# Patient Record
Sex: Female | Born: 1940 | Race: White | Hispanic: No | Smoking: Former smoker
Health system: Southern US, Community
[De-identification: ages and names within clinical notes are randomized; demographics above are authoritative.]

## PROBLEM LIST (undated history)

## (undated) DIAGNOSIS — M1990 Unspecified osteoarthritis, unspecified site: Secondary | ICD-10-CM

## (undated) DIAGNOSIS — F32A Depression, unspecified: Secondary | ICD-10-CM

## (undated) DIAGNOSIS — K5903 Drug induced constipation: Secondary | ICD-10-CM

## (undated) DIAGNOSIS — M545 Low back pain, unspecified: Secondary | ICD-10-CM

## (undated) DIAGNOSIS — K219 Gastro-esophageal reflux disease without esophagitis: Secondary | ICD-10-CM

## (undated) DIAGNOSIS — G8929 Other chronic pain: Secondary | ICD-10-CM

## (undated) DIAGNOSIS — K5190 Ulcerative colitis, unspecified, without complications: Secondary | ICD-10-CM

## (undated) DIAGNOSIS — E119 Type 2 diabetes mellitus without complications: Secondary | ICD-10-CM

## (undated) DIAGNOSIS — R251 Tremor, unspecified: Secondary | ICD-10-CM

## (undated) DIAGNOSIS — F329 Major depressive disorder, single episode, unspecified: Secondary | ICD-10-CM

## (undated) DIAGNOSIS — E1142 Type 2 diabetes mellitus with diabetic polyneuropathy: Secondary | ICD-10-CM

## (undated) HISTORY — PX: JOINT REPLACEMENT: SHX530

## (undated) HISTORY — PX: BACK SURGERY: SHX140

## (undated) HISTORY — PX: CATARACT EXTRACTION W/ INTRAOCULAR LENS  IMPLANT, BILATERAL: SHX1307

## (undated) HISTORY — PX: COLONOSCOPY: SHX174

## (undated) HISTORY — PX: TOTAL HIP ARTHROPLASTY: SHX124

---

## 2011-07-10 HISTORY — PX: TOTAL SHOULDER ARTHROPLASTY: SHX126

## 2015-07-22 ENCOUNTER — Other Ambulatory Visit: Payer: Self-pay | Admitting: Neurosurgery

## 2015-08-07 ENCOUNTER — Encounter (HOSPITAL_COMMUNITY)
Admission: RE | Admit: 2015-08-07 | Discharge: 2015-08-07 | Disposition: A | Discharge status: Home / Self Care | Payer: Medicare Other | Source: Ambulatory Visit | Attending: Neurosurgery | Admitting: Neurosurgery

## 2015-08-07 ENCOUNTER — Encounter (HOSPITAL_COMMUNITY): Payer: Self-pay

## 2015-08-07 DIAGNOSIS — Z0183 Encounter for blood typing: Secondary | ICD-10-CM | POA: Insufficient documentation

## 2015-08-07 DIAGNOSIS — M4316 Spondylolisthesis, lumbar region: Secondary | ICD-10-CM | POA: Diagnosis not present

## 2015-08-07 DIAGNOSIS — E119 Type 2 diabetes mellitus without complications: Secondary | ICD-10-CM | POA: Diagnosis not present

## 2015-08-07 DIAGNOSIS — Z794 Long term (current) use of insulin: Secondary | ICD-10-CM | POA: Insufficient documentation

## 2015-08-07 DIAGNOSIS — Z01812 Encounter for preprocedural laboratory examination: Secondary | ICD-10-CM | POA: Diagnosis not present

## 2015-08-07 DIAGNOSIS — Z79899 Other long term (current) drug therapy: Secondary | ICD-10-CM | POA: Insufficient documentation

## 2015-08-07 HISTORY — DX: Gastro-esophageal reflux disease without esophagitis: K21.9

## 2015-08-07 HISTORY — DX: Depression, unspecified: F32.A

## 2015-08-07 HISTORY — DX: Drug induced constipation: K59.03

## 2015-08-07 HISTORY — DX: Major depressive disorder, single episode, unspecified: F32.9

## 2015-08-07 HISTORY — DX: Tremor, unspecified: R25.1

## 2015-08-07 HISTORY — DX: Ulcerative colitis, unspecified, without complications: K51.90

## 2015-08-07 HISTORY — DX: Unspecified osteoarthritis, unspecified site: M19.90

## 2015-08-07 LAB — CBC
HCT: 42.5 % (ref 36.0–46.0)
Hemoglobin: 14.5 g/dL (ref 12.0–15.0)
MCH: 31.9 pg (ref 26.0–34.0)
MCHC: 34.1 g/dL (ref 30.0–36.0)
MCV: 93.4 fL (ref 78.0–100.0)
PLATELETS: 237 10*3/uL (ref 150–400)
RBC: 4.55 MIL/uL (ref 3.87–5.11)
RDW: 12.6 % (ref 11.5–15.5)
WBC: 9.4 10*3/uL (ref 4.0–10.5)

## 2015-08-07 LAB — BASIC METABOLIC PANEL
Anion gap: 11 (ref 5–15)
BUN: 19 mg/dL (ref 6–20)
CHLORIDE: 104 mmol/L (ref 101–111)
CO2: 22 mmol/L (ref 22–32)
CREATININE: 0.98 mg/dL (ref 0.44–1.00)
Calcium: 9.9 mg/dL (ref 8.9–10.3)
GFR calc Af Amer: >60 mL/min (ref >60)
GFR, EST NON AFRICAN AMERICAN: 54 mL/min — AB (ref >60)
GLUCOSE: 121 mg/dL — AB (ref 65–99)
Potassium: 4.2 mmol/L (ref 3.5–5.1)
SODIUM: 137 mmol/L (ref 135–145)

## 2015-08-07 LAB — ABO/RH: ABO/RH(D): A POS

## 2015-08-07 LAB — TYPE AND SCREEN
ABO/RH(D): A POS
Antibody Screen: NEGATIVE

## 2015-08-07 LAB — SURGICAL PCR SCREEN
MRSA, PCR: NEGATIVE
Staphylococcus aureus: NEGATIVE

## 2015-08-07 LAB — GLUCOSE, CAPILLARY: GLUCOSE-CAPILLARY: 95 mg/dL (ref 65–99)

## 2015-08-07 NOTE — Pre-Procedure Instructions (Signed)
Sheri Reid  08/07/2015    Your procedure is scheduled on Thursday, March 2  Report to [facility] Admitting at 5:30 A.M.                     Your surgery or procedure is scheduled for 7:30 AM   Call this number if you have problems the morning of surgery:[phone]                For any other questions, please call [phone], Monday - Friday 8 AM - 4 PM.   Remember:  Do not eat food or drink liquids after midnight Wednesday, March 1  Take these medicines the morning of surgery with A SIP OF WATER :APRISO.                Take if needed: Tramadol                  Stop taking Aspirin, Coumadin, Plavix, Effient and Herbal medications- Vitamins.  Do not take any NSAIDs ie: Ibuprofen,  Advil,Naproxen or any medication containing Aspirin.  How to Manage Your Diabetes Before Surgery Why is it important to control my blood sugar before and after surgery?   Improving blood sugar levels before and after surgery helps healing and can limit problems.  A way of improving blood sugar control is eating a healthy diet by:  - Eating less sugar and carbohydrates  - Increasing activity/exercise  - Talk with your doctor about reaching your blood sugar goals  High blood sugars (greater than 180 mg/dL) can raise your risk of infections and slow down your recovery so you will need to focus on controlling your diabetes during the weeks before surgery.  Make sure that the doctor who takes care of your diabetes knows about your planned surgery including the date and location.  How do I manage my blood sugars before surgery?   Check your blood sugar at least 4 times a day, 2 days before surgery to make sure that they are not too high or low.   Check your blood sugar the morning of your surgery when you wake up and every 2 hours until you get to the Short-Stay unit.  If your blood sugar is less than 70 mg/dL, you will need to treat for low blood sugar by:  Treat a low blood sugar  (less than 70 mg/dL) with 1/2 cup of clear juice (cranberry or apple), 4 glucose tablets, OR glucose gel.  Recheck blood sugar in 15 minutes after treatment (to make sure it is greater than 70 mg/dL).  If blood sugar is not greater than 70 mg/dL on re-check, call [phone] for further instructions.   Report your blood sugar to the Short-Stay nurse when you get to Short-Stay.  References:  [facility], 2007 "How to Manage your Diabetes Before and After Surgery".  What do I do about my diabetes medications?   Do not take oral diabetes medicines (pills) the morning of surgery.    THE NIGHT BEFORE SURGERY, take 35  units of Lantus Insulin.   Do not take other diabetes injectables the day of surgery including Byetta, Victoza, Bydureon, and Trulicity.    If your CBG is greater than 220 mg/dL, you may take 1/2 of your sliding scale (correction) dose of insul    Do not wear jewelry, make-up or nail polish.  Do not wear lotions, powders, or perfumes  Do not shave 48  hours prior to surgery.    Do not bring valuables to the hospital.  [facility] is not responsible for any belongings or valuables.  Contacts, dentures or bridgework may not be worn into surgery.  Leave your suitcase in the car.  After surgery it may be brought to your room.  For patients admitted to the hospital, discharge time will be determined by your treatment team.  Patients discharged the day of surgery will not be allowed to drive home.   Name and phone number of your driver:   -  Special instructions:  Review  [facility] - Preparing For Surgery.  Please read over the following fact sheets that you were given. Pain Booklet, Coughing and Deep Breathing, Blood Transfusion Information, MRSA Information and Surgical Site Infection Prevention

## 2015-08-07 NOTE — Progress Notes (Signed)
Ms Sheri Reid denies chest pain or shortness of breath. Patient reports that she see Dr Cyndie Mull ([place] every year and has an EKG every year.  "I was there in July 2016.  Patient also reports that she had a stress test in the past 4 years.  I sent a fax requesting records.  I also requested records from Dr Kenton Kingfisher, patients PCP. Pats endocrinologist is Dr Bud Face at [facility].

## 2015-08-08 LAB — HEMOGLOBIN A1C
Hgb A1c MFr Bld: 7.7 % — ABNORMAL HIGH (ref 4.8–5.6)
MEAN PLASMA GLUCOSE: 174 mg/dL

## 2015-08-11 NOTE — Progress Notes (Signed)
Spoke with office of Dr Alroy Dust in [place] and re-requested EKG, Stress, ECHO, and OV notes.  Stated they will fax now.  Spoke with Dr Kenton Kingfisher' office and left voice mail for medical records previously requested (Labs, OV note)

## 2015-08-12 MED ORDER — CEFAZOLIN SODIUM-DEXTROSE 2-3 GM-% IV SOLR
2.0000 g | INTRAVENOUS | Status: AC
  Administered 2015-08-13 (×2): 2 g via INTRAVENOUS

## 2015-08-13 ENCOUNTER — Inpatient Hospital Stay (HOSPITAL_COMMUNITY): Payer: Medicare Other

## 2015-08-13 ENCOUNTER — Inpatient Hospital Stay (HOSPITAL_COMMUNITY)
Admission: RE | Admit: 2015-08-13 | Discharge: 2015-08-26 | DRG: 456 | Disposition: A | Discharge status: Home Health | Payer: Medicare Other | Source: Ambulatory Visit | Attending: Family Medicine | Admitting: Family Medicine

## 2015-08-13 ENCOUNTER — Inpatient Hospital Stay (HOSPITAL_COMMUNITY): Payer: Medicare Other | Admitting: Anesthesiology

## 2015-08-13 ENCOUNTER — Encounter (HOSPITAL_COMMUNITY): Payer: Self-pay | Admitting: Surgery

## 2015-08-13 ENCOUNTER — Encounter (HOSPITAL_COMMUNITY)
Admission: RE | Disposition: A | Discharge status: Home Health | Payer: Self-pay | Source: Ambulatory Visit | Attending: Neurosurgery

## 2015-08-13 DIAGNOSIS — K5669 Other intestinal obstruction: Secondary | ICD-10-CM | POA: Diagnosis not present

## 2015-08-13 DIAGNOSIS — M4806 Spinal stenosis, lumbar region: Secondary | ICD-10-CM | POA: Diagnosis present

## 2015-08-13 DIAGNOSIS — M419 Scoliosis, unspecified: Secondary | ICD-10-CM | POA: Diagnosis present

## 2015-08-13 DIAGNOSIS — E11649 Type 2 diabetes mellitus with hypoglycemia without coma: Secondary | ICD-10-CM | POA: Diagnosis not present

## 2015-08-13 DIAGNOSIS — K219 Gastro-esophageal reflux disease without esophagitis: Secondary | ICD-10-CM | POA: Diagnosis present

## 2015-08-13 DIAGNOSIS — Z87891 Personal history of nicotine dependence: Secondary | ICD-10-CM

## 2015-08-13 DIAGNOSIS — R109 Unspecified abdominal pain: Secondary | ICD-10-CM

## 2015-08-13 DIAGNOSIS — K598 Other specified functional intestinal disorders: Secondary | ICD-10-CM | POA: Diagnosis not present

## 2015-08-13 DIAGNOSIS — Z833 Family history of diabetes mellitus: Secondary | ICD-10-CM | POA: Diagnosis not present

## 2015-08-13 DIAGNOSIS — M5116 Intervertebral disc disorders with radiculopathy, lumbar region: Secondary | ICD-10-CM | POA: Diagnosis present

## 2015-08-13 DIAGNOSIS — Z9842 Cataract extraction status, left eye: Secondary | ICD-10-CM | POA: Diagnosis not present

## 2015-08-13 DIAGNOSIS — I10 Essential (primary) hypertension: Secondary | ICD-10-CM | POA: Diagnosis present

## 2015-08-13 DIAGNOSIS — K5190 Ulcerative colitis, unspecified, without complications: Secondary | ICD-10-CM

## 2015-08-13 DIAGNOSIS — R911 Solitary pulmonary nodule: Secondary | ICD-10-CM

## 2015-08-13 DIAGNOSIS — R197 Diarrhea, unspecified: Secondary | ICD-10-CM | POA: Diagnosis not present

## 2015-08-13 DIAGNOSIS — R140 Abdominal distension (gaseous): Secondary | ICD-10-CM | POA: Diagnosis not present

## 2015-08-13 DIAGNOSIS — M4316 Spondylolisthesis, lumbar region: Secondary | ICD-10-CM | POA: Diagnosis present

## 2015-08-13 DIAGNOSIS — R112 Nausea with vomiting, unspecified: Secondary | ICD-10-CM

## 2015-08-13 DIAGNOSIS — Z794 Long term (current) use of insulin: Secondary | ICD-10-CM | POA: Diagnosis not present

## 2015-08-13 DIAGNOSIS — E118 Type 2 diabetes mellitus with unspecified complications: Secondary | ICD-10-CM

## 2015-08-13 DIAGNOSIS — K5100 Ulcerative (chronic) pancolitis without complications: Secondary | ICD-10-CM | POA: Diagnosis not present

## 2015-08-13 DIAGNOSIS — M1990 Unspecified osteoarthritis, unspecified site: Secondary | ICD-10-CM | POA: Diagnosis present

## 2015-08-13 DIAGNOSIS — M549 Dorsalgia, unspecified: Secondary | ICD-10-CM | POA: Diagnosis present

## 2015-08-13 DIAGNOSIS — Z96643 Presence of artificial hip joint, bilateral: Secondary | ICD-10-CM | POA: Diagnosis present

## 2015-08-13 DIAGNOSIS — K56609 Unspecified intestinal obstruction, unspecified as to partial versus complete obstruction: Secondary | ICD-10-CM

## 2015-08-13 DIAGNOSIS — K6812 Psoas muscle abscess: Secondary | ICD-10-CM | POA: Diagnosis not present

## 2015-08-13 DIAGNOSIS — M4120 Other idiopathic scoliosis, site unspecified: Secondary | ICD-10-CM | POA: Diagnosis present

## 2015-08-13 DIAGNOSIS — Z9889 Other specified postprocedural states: Secondary | ICD-10-CM

## 2015-08-13 DIAGNOSIS — K913 Postprocedural intestinal obstruction: Secondary | ICD-10-CM | POA: Diagnosis not present

## 2015-08-13 DIAGNOSIS — K5660 Unspecified intestinal obstruction: Secondary | ICD-10-CM | POA: Diagnosis not present

## 2015-08-13 DIAGNOSIS — Z79899 Other long term (current) drug therapy: Secondary | ICD-10-CM | POA: Diagnosis not present

## 2015-08-13 DIAGNOSIS — E1142 Type 2 diabetes mellitus with diabetic polyneuropathy: Secondary | ICD-10-CM | POA: Diagnosis present

## 2015-08-13 DIAGNOSIS — R1084 Generalized abdominal pain: Secondary | ICD-10-CM | POA: Diagnosis not present

## 2015-08-13 DIAGNOSIS — Z961 Presence of intraocular lens: Secondary | ICD-10-CM | POA: Diagnosis present

## 2015-08-13 DIAGNOSIS — G8918 Other acute postprocedural pain: Secondary | ICD-10-CM

## 2015-08-13 DIAGNOSIS — Z9841 Cataract extraction status, right eye: Secondary | ICD-10-CM | POA: Diagnosis not present

## 2015-08-13 DIAGNOSIS — K5939 Other megacolon: Secondary | ICD-10-CM | POA: Diagnosis not present

## 2015-08-13 DIAGNOSIS — Z96611 Presence of right artificial shoulder joint: Secondary | ICD-10-CM | POA: Diagnosis present

## 2015-08-13 DIAGNOSIS — M5440 Lumbago with sciatica, unspecified side: Secondary | ICD-10-CM | POA: Diagnosis present

## 2015-08-13 DIAGNOSIS — K567 Ileus, unspecified: Secondary | ICD-10-CM | POA: Diagnosis not present

## 2015-08-13 DIAGNOSIS — E162 Hypoglycemia, unspecified: Secondary | ICD-10-CM

## 2015-08-13 HISTORY — DX: Type 2 diabetes mellitus with diabetic polyneuropathy: E11.42

## 2015-08-13 HISTORY — PX: ANTERIOR LATERAL LUMBAR FUSION WITH PERCUTANEOUS SCREW 3 LEVEL: SHX5555

## 2015-08-13 HISTORY — DX: Low back pain: M54.5

## 2015-08-13 HISTORY — PX: LUMBAR PERCUTANEOUS PEDICLE SCREW 3 LEVEL: SHX5562

## 2015-08-13 HISTORY — DX: Other chronic pain: G89.29

## 2015-08-13 HISTORY — DX: Low back pain, unspecified: M54.50

## 2015-08-13 HISTORY — PX: ANTERIOR LAT LUMBAR FUSION: SHX1168

## 2015-08-13 HISTORY — DX: Type 2 diabetes mellitus without complications: E11.9

## 2015-08-13 LAB — GLUCOSE, CAPILLARY
GLUCOSE-CAPILLARY: 165 mg/dL — AB (ref 65–99)
Glucose-Capillary: 120 mg/dL — ABNORMAL HIGH (ref 65–99)
Glucose-Capillary: 106 mg/dL — ABNORMAL HIGH (ref 65–99)

## 2015-08-13 SURGERY — ANTERIOR LATERAL LUMBAR FUSION 3 LEVELS
Anesthesia: General | Site: Flank

## 2015-08-13 MED ORDER — HYDROCODONE-ACETAMINOPHEN 5-325 MG PO TABS: 1.0000 | ORAL_TABLET | Freq: Three times a day (TID) | ORAL | Status: DC | PRN

## 2015-08-13 MED ORDER — SODIUM CHLORIDE 0.9% FLUSH: 3.0000 mL | INTRAVENOUS | Status: DC | PRN

## 2015-08-13 MED ORDER — TIZANIDINE HCL 2 MG PO TABS
1.0000 mg | ORAL_TABLET | Freq: Every day | ORAL | Status: DC
  Administered 2015-08-13 – 2015-08-25 (×12): 1 mg via ORAL
  Filled 2015-08-13 (×13): qty 0.5

## 2015-08-13 MED ORDER — LIRAGLUTIDE 18 MG/3ML ~~LOC~~ SOPN
1.8000 mg | PEN_INJECTOR | SUBCUTANEOUS | Status: DC
  Administered 2015-08-15: 1.8 mg via SUBCUTANEOUS

## 2015-08-13 MED ORDER — FENTANYL CITRATE (PF) 100 MCG/2ML IJ SOLN
INTRAMUSCULAR | Status: DC | PRN
  Administered 2015-08-13: 25 ug via INTRAVENOUS
  Administered 2015-08-13 (×4): 50 ug via INTRAVENOUS
  Administered 2015-08-13: 25 ug via INTRAVENOUS
  Administered 2015-08-13 (×2): 50 ug via INTRAVENOUS

## 2015-08-13 MED ORDER — PROPOFOL 10 MG/ML IV BOLUS
INTRAVENOUS | Status: AC
  Filled 2015-08-13: qty 40

## 2015-08-13 MED ORDER — HYDROMORPHONE HCL 1 MG/ML IJ SOLN
0.2500 mg | INTRAMUSCULAR | Status: DC | PRN
  Administered 2015-08-13 (×4): 0.5 mg via INTRAVENOUS

## 2015-08-13 MED ORDER — INSULIN GLARGINE 100 UNIT/ML SOLOSTAR PEN: 44.0000 [IU] | PEN_INJECTOR | Freq: Every day | SUBCUTANEOUS | Status: DC

## 2015-08-13 MED ORDER — LISINOPRIL 10 MG PO TABS
10.0000 mg | ORAL_TABLET | Freq: Every day | ORAL | Status: DC
  Administered 2015-08-13 – 2015-08-26 (×12): 10 mg via ORAL
  Filled 2015-08-13 (×13): qty 1

## 2015-08-13 MED ORDER — MIDAZOLAM HCL 2 MG/2ML IJ SOLN
INTRAMUSCULAR | Status: AC
  Filled 2015-08-13: qty 2

## 2015-08-13 MED ORDER — VITAMIN B-12 1000 MCG PO TABS
3000.0000 ug | ORAL_TABLET | Freq: Every day | ORAL | Status: DC
  Administered 2015-08-14 – 2015-08-26 (×9): 3000 ug via ORAL
  Filled 2015-08-13 (×13): qty 3

## 2015-08-13 MED ORDER — ACETAMINOPHEN 325 MG PO TABS
650.0000 mg | ORAL_TABLET | ORAL | Status: DC | PRN
  Administered 2015-08-25 – 2015-08-26 (×2): 650 mg via ORAL
  Filled 2015-08-13 (×2): qty 2

## 2015-08-13 MED ORDER — VITAMIN D (ERGOCALCIFEROL) 1.25 MG (50000 UNIT) PO CAPS
50000.0000 [IU] | ORAL_CAPSULE | ORAL | Status: DC
  Administered 2015-08-15: 50000 [IU] via ORAL
  Filled 2015-08-13 (×2): qty 1

## 2015-08-13 MED ORDER — RISAQUAD PO CAPS
ORAL_CAPSULE | Freq: Every day | ORAL | Status: DC
  Administered 2015-08-14 – 2015-08-26 (×9): 1 via ORAL
  Filled 2015-08-13 (×12): qty 1

## 2015-08-13 MED ORDER — METFORMIN HCL 500 MG PO TABS
1000.0000 mg | ORAL_TABLET | Freq: Two times a day (BID) | ORAL | Status: DC
  Administered 2015-08-14 – 2015-08-18 (×9): 1000 mg via ORAL
  Filled 2015-08-13 (×12): qty 2

## 2015-08-13 MED ORDER — LEVOCETIRIZINE DIHYDROCHLORIDE 5 MG PO TABS: 5.0000 mg | ORAL_TABLET | Freq: Every day | ORAL | Status: DC

## 2015-08-13 MED ORDER — INSULIN GLARGINE 100 UNIT/ML ~~LOC~~ SOLN
44.0000 [IU] | Freq: Every day | SUBCUTANEOUS | Status: DC
  Administered 2015-08-15 – 2015-08-18 (×5): 44 [IU] via SUBCUTANEOUS
  Filled 2015-08-13 (×6): qty 0.44

## 2015-08-13 MED ORDER — DOCUSATE SODIUM 100 MG PO CAPS
100.0000 mg | ORAL_CAPSULE | Freq: Two times a day (BID) | ORAL | Status: DC
  Administered 2015-08-13 – 2015-08-26 (×23): 100 mg via ORAL
  Filled 2015-08-13 (×25): qty 1

## 2015-08-13 MED ORDER — INSULIN ASPART 100 UNIT/ML FLEXPEN: 1.0000 [IU] | PEN_INJECTOR | Freq: Every day | SUBCUTANEOUS | Status: DC | PRN

## 2015-08-13 MED ORDER — MIDAZOLAM HCL 5 MG/5ML IJ SOLN
INTRAMUSCULAR | Status: DC | PRN
  Administered 2015-08-13: 1 mg via INTRAVENOUS

## 2015-08-13 MED ORDER — HYDROMORPHONE HCL 1 MG/ML IJ SOLN
INTRAMUSCULAR | Status: AC
  Filled 2015-08-13: qty 1

## 2015-08-13 MED ORDER — PHENOL 1.4 % MT LIQD
1.0000 | OROMUCOSAL | Status: DC | PRN
Start: 2015-08-13 — End: 2015-08-26
  Administered 2015-08-17: 1 via OROMUCOSAL
  Filled 2015-08-13: qty 177

## 2015-08-13 MED ORDER — ACETAMINOPHEN 500 MG PO TABS: 500.0000 mg | ORAL_TABLET | Freq: Two times a day (BID) | ORAL | Status: DC | PRN

## 2015-08-13 MED ORDER — ALUM & MAG HYDROXIDE-SIMETH 200-200-20 MG/5ML PO SUSP
30.0000 mL | Freq: Four times a day (QID) | ORAL | Status: DC | PRN
  Administered 2015-08-16 – 2015-08-19 (×3): 30 mL via ORAL
  Filled 2015-08-13 (×3): qty 30

## 2015-08-13 MED ORDER — PHENYLEPHRINE HCL 10 MG/ML IJ SOLN
10.0000 mg | INTRAMUSCULAR | Status: DC | PRN
  Administered 2015-08-13: 25 ug/min via INTRAVENOUS

## 2015-08-13 MED ORDER — SODIUM CHLORIDE 0.9% FLUSH
3.0000 mL | Freq: Two times a day (BID) | INTRAVENOUS | Status: DC
  Administered 2015-08-13 – 2015-08-23 (×14): 3 mL via INTRAVENOUS
  Administered 2015-08-24: 03:00:00 via INTRAVENOUS
  Administered 2015-08-24 – 2015-08-26 (×5): 3 mL via INTRAVENOUS

## 2015-08-13 MED ORDER — LORATADINE 10 MG PO TABS
10.0000 mg | ORAL_TABLET | Freq: Every day | ORAL | Status: DC
  Administered 2015-08-13 – 2015-08-26 (×11): 10 mg via ORAL
  Filled 2015-08-13 (×13): qty 1

## 2015-08-13 MED ORDER — METHOCARBAMOL 1000 MG/10ML IJ SOLN: 500.0000 mg | Freq: Four times a day (QID) | INTRAVENOUS | Status: DC | PRN

## 2015-08-13 MED ORDER — PROMETHAZINE HCL 25 MG/ML IJ SOLN: 6.2500 mg | INTRAMUSCULAR | Status: DC | PRN

## 2015-08-13 MED ORDER — FENTANYL CITRATE (PF) 250 MCG/5ML IJ SOLN
INTRAMUSCULAR | Status: AC
  Filled 2015-08-13: qty 5

## 2015-08-13 MED ORDER — ADULT MULTIVITAMIN W/MINERALS CH
1.0000 | ORAL_TABLET | Freq: Every day | ORAL | Status: DC
  Administered 2015-08-13 – 2015-08-26 (×10): 1 via ORAL
  Filled 2015-08-13 (×14): qty 1

## 2015-08-13 MED ORDER — CO Q-10 200 MG PO CAPS: 200.0000 mg | ORAL_CAPSULE | Freq: Every day | ORAL | Status: DC

## 2015-08-13 MED ORDER — SODIUM CHLORIDE 0.9 % IV SOLN
250.0000 mL | INTRAVENOUS | Status: DC
  Administered 2015-08-13: 250 mL via INTRAVENOUS

## 2015-08-13 MED ORDER — HYDROCODONE-ACETAMINOPHEN 5-325 MG PO TABS
1.0000 | ORAL_TABLET | ORAL | Status: DC | PRN
  Administered 2015-08-14: 1 via ORAL
  Administered 2015-08-15 – 2015-08-20 (×4): 2 via ORAL
  Filled 2015-08-13 (×4): qty 2
  Filled 2015-08-13: qty 1
  Filled 2015-08-13: qty 2

## 2015-08-13 MED ORDER — 0.9 % SODIUM CHLORIDE (POUR BTL) OPTIME
TOPICAL | Status: DC | PRN
  Administered 2015-08-13: 1000 mL

## 2015-08-13 MED ORDER — POLYETHYLENE GLYCOL 3350 17 G PO PACK
17.0000 g | PACK | Freq: Every day | ORAL | Status: DC | PRN
  Administered 2015-08-15 – 2015-08-18 (×2): 17 g via ORAL
  Filled 2015-08-13 (×4): qty 1

## 2015-08-13 MED ORDER — BUPIVACAINE HCL (PF) 0.5 % IJ SOLN
INTRAMUSCULAR | Status: DC | PRN
  Administered 2015-08-13: 15 mL

## 2015-08-13 MED ORDER — BUPIVACAINE LIPOSOME 1.3 % IJ SUSP
20.0000 mL | Freq: Once | INTRAMUSCULAR | Status: DC
  Filled 2015-08-13: qty 20

## 2015-08-13 MED ORDER — HYDROMORPHONE HCL 1 MG/ML IJ SOLN
0.5000 mg | INTRAMUSCULAR | Status: DC | PRN
  Administered 2015-08-13 – 2015-08-14 (×4): 1 mg via INTRAVENOUS
  Administered 2015-08-15: 0.5 mg via INTRAVENOUS
  Administered 2015-08-15 – 2015-08-16 (×5): 1 mg via INTRAVENOUS
  Administered 2015-08-16: 0.5 mg via INTRAVENOUS
  Administered 2015-08-16 – 2015-08-25 (×19): 1 mg via INTRAVENOUS
  Filled 2015-08-13 (×31): qty 1

## 2015-08-13 MED ORDER — LIDOCAINE-EPINEPHRINE 1 %-1:100000 IJ SOLN
INTRAMUSCULAR | Status: DC | PRN
  Administered 2015-08-13: 15 mL

## 2015-08-13 MED ORDER — ONDANSETRON HCL 4 MG/2ML IJ SOLN
INTRAMUSCULAR | Status: DC | PRN
  Administered 2015-08-13: 4 mg via INTRAVENOUS

## 2015-08-13 MED ORDER — BIOTIN 5 MG PO CAPS: 5000.0000 mg | ORAL_CAPSULE | Freq: Every day | ORAL | Status: DC

## 2015-08-13 MED ORDER — ACETAMINOPHEN 10 MG/ML IV SOLN
INTRAVENOUS | Status: AC
  Administered 2015-08-13: 1000 mg via INTRAVENOUS
  Filled 2015-08-13: qty 100

## 2015-08-13 MED ORDER — PROPOFOL 10 MG/ML IV BOLUS
INTRAVENOUS | Status: DC | PRN
  Administered 2015-08-13: 150 mg via INTRAVENOUS

## 2015-08-13 MED ORDER — PROPOFOL 1000 MG/100ML IV EMUL
INTRAVENOUS | Status: AC
Start: 2015-08-13 — End: 2015-08-13
  Administered 2015-08-13: 50 ug/kg/min via INTRAVENOUS
  Filled 2015-08-13: qty 100

## 2015-08-13 MED ORDER — CEFAZOLIN SODIUM-DEXTROSE 2-3 GM-% IV SOLR
2.0000 g | Freq: Three times a day (TID) | INTRAVENOUS | Status: AC
  Administered 2015-08-13 – 2015-08-14 (×2): 2 g via INTRAVENOUS
  Filled 2015-08-13 (×2): qty 50

## 2015-08-13 MED ORDER — BISACODYL 10 MG RE SUPP
10.0000 mg | Freq: Every day | RECTAL | Status: DC | PRN
  Administered 2015-08-15 – 2015-08-16 (×2): 10 mg via RECTAL
  Filled 2015-08-13 (×3): qty 1

## 2015-08-13 MED ORDER — LIDOCAINE HCL (CARDIAC) 20 MG/ML IV SOLN
INTRAVENOUS | Status: DC | PRN
  Administered 2015-08-13: 60 mg via INTRAVENOUS

## 2015-08-13 MED ORDER — SODIUM CHLORIDE 0.9 % IV SOLN
INTRAVENOUS | Status: DC | PRN
  Administered 2015-08-13 (×2): via INTRAVENOUS

## 2015-08-13 MED ORDER — VITAMIN C 500 MG PO TABS
1000.0000 mg | ORAL_TABLET | Freq: Every day | ORAL | Status: DC
  Administered 2015-08-13 – 2015-08-26 (×10): 1000 mg via ORAL
  Filled 2015-08-13 (×13): qty 2

## 2015-08-13 MED ORDER — INSULIN ASPART 100 UNIT/ML ~~LOC~~ SOLN
1.0000 [IU] | Freq: Every day | SUBCUTANEOUS | Status: DC | PRN
  Administered 2015-08-15: 2 [IU] via SUBCUTANEOUS
  Filled 2015-08-13: qty 0.02

## 2015-08-13 MED ORDER — MESALAMINE ER 250 MG PO CPCR
750.0000 mg | ORAL_CAPSULE | Freq: Every day | ORAL | Status: DC
  Administered 2015-08-14 – 2015-08-25 (×7): 750 mg via ORAL
  Filled 2015-08-13 (×12): qty 3

## 2015-08-13 MED ORDER — MESALAMINE ER 250 MG PO CPCR
750.0000 mg | ORAL_CAPSULE | Freq: Every day | ORAL | Status: DC
  Filled 2015-08-13: qty 3

## 2015-08-13 MED ORDER — METHOCARBAMOL 500 MG PO TABS
500.0000 mg | ORAL_TABLET | Freq: Four times a day (QID) | ORAL | Status: DC | PRN
  Administered 2015-08-14 – 2015-08-26 (×14): 500 mg via ORAL
  Filled 2015-08-13 (×17): qty 1

## 2015-08-13 MED ORDER — OXYCODONE-ACETAMINOPHEN 5-325 MG PO TABS
1.0000 | ORAL_TABLET | ORAL | Status: DC | PRN
  Administered 2015-08-13 – 2015-08-15 (×6): 2 via ORAL
  Administered 2015-08-15: 1 via ORAL
  Administered 2015-08-17 – 2015-08-18 (×3): 2 via ORAL
  Filled 2015-08-13 (×6): qty 2
  Filled 2015-08-13: qty 1
  Filled 2015-08-13 (×4): qty 2

## 2015-08-13 MED ORDER — BUPIVACAINE LIPOSOME 1.3 % IJ SUSP
INTRAMUSCULAR | Status: DC | PRN
  Administered 2015-08-13: 20 mL

## 2015-08-13 MED ORDER — EPHEDRINE SULFATE 50 MG/ML IJ SOLN
INTRAMUSCULAR | Status: DC | PRN
  Administered 2015-08-13 (×3): 10 mg via INTRAVENOUS

## 2015-08-13 MED ORDER — LACTATED RINGERS IV SOLN
INTRAVENOUS | Status: DC | PRN
  Administered 2015-08-13 (×3): via INTRAVENOUS

## 2015-08-13 MED ORDER — ZOLPIDEM TARTRATE 5 MG PO TABS
5.0000 mg | ORAL_TABLET | Freq: Every evening | ORAL | Status: DC | PRN
  Administered 2015-08-19 – 2015-08-22 (×3): 5 mg via ORAL
  Filled 2015-08-13 (×4): qty 1

## 2015-08-13 MED ORDER — KCL IN DEXTROSE-NACL 20-5-0.45 MEQ/L-%-% IV SOLN
INTRAVENOUS | Status: DC
  Administered 2015-08-13 – 2015-08-17 (×3): via INTRAVENOUS
  Administered 2015-08-18: 1000 mL via INTRAVENOUS
  Administered 2015-08-19: 20:00:00 via INTRAVENOUS
  Filled 2015-08-13 (×17): qty 1000

## 2015-08-13 MED ORDER — CRANBERRY 500 MG PO CAPS: 500.0000 mg | ORAL_CAPSULE | Freq: Every day | ORAL | Status: DC

## 2015-08-13 MED ORDER — MENTHOL 3 MG MT LOZG: 1.0000 | LOZENGE | OROMUCOSAL | Status: DC | PRN

## 2015-08-13 MED ORDER — ONDANSETRON HCL 4 MG/2ML IJ SOLN
4.0000 mg | INTRAMUSCULAR | Status: DC | PRN
  Administered 2015-08-13 – 2015-08-19 (×17): 4 mg via INTRAVENOUS
  Filled 2015-08-13 (×18): qty 2

## 2015-08-13 MED ORDER — PHENYLEPHRINE HCL 10 MG/ML IJ SOLN
INTRAMUSCULAR | Status: DC | PRN
  Administered 2015-08-13 (×4): 80 ug via INTRAVENOUS

## 2015-08-13 MED ORDER — MESALAMINE ER 0.375 G PO CP24: 0.3750 g | ORAL_CAPSULE | Freq: Every day | ORAL | Status: DC

## 2015-08-13 MED ORDER — PANTOPRAZOLE SODIUM 40 MG IV SOLR
40.0000 mg | Freq: Every day | INTRAVENOUS | Status: DC
  Administered 2015-08-13: 40 mg via INTRAVENOUS
  Filled 2015-08-13: qty 40

## 2015-08-13 MED ORDER — SUCCINYLCHOLINE 20MG/ML (10ML) SYRINGE FOR MEDFUSION PUMP - OPTIME
INTRAMUSCULAR | Status: DC | PRN
  Administered 2015-08-13: 100 mg via INTRAVENOUS

## 2015-08-13 MED ORDER — FLEET ENEMA 7-19 GM/118ML RE ENEM
1.0000 | ENEMA | Freq: Once | RECTAL | Status: AC | PRN
  Administered 2015-08-15: 1 via RECTAL
  Filled 2015-08-13: qty 1

## 2015-08-13 MED ORDER — TRAMADOL HCL 50 MG PO TABS
50.0000 mg | ORAL_TABLET | Freq: Four times a day (QID) | ORAL | Status: DC | PRN
  Administered 2015-08-15 – 2015-08-26 (×22): 50 mg via ORAL
  Filled 2015-08-13 (×22): qty 1

## 2015-08-13 MED ORDER — ACETAMINOPHEN 650 MG RE SUPP: 650.0000 mg | RECTAL | Status: DC | PRN

## 2015-08-13 SURGICAL SUPPLY — 79 items
BENZOIN TINCTURE PRP APPL 2/3 (GAUZE/BANDAGES/DRESSINGS) ×4 IMPLANT
BLADE CLIPPER SURG (BLADE) IMPLANT
CLIP NEUROVISION LG (CLIP) ×4 IMPLANT
CLOSURE WOUND 1/2 X4 (GAUZE/BANDAGES/DRESSINGS) ×1
CONT SPEC 4OZ CLIKSEAL STRL BL (MISCELLANEOUS) ×4 IMPLANT
COVER BACK TABLE 24X17X13 BIG (DRAPES) IMPLANT
COVER BACK TABLE 60X90IN (DRAPES) ×4 IMPLANT
DECANTER SPIKE VIAL GLASS SM (MISCELLANEOUS) ×4 IMPLANT
DERMABOND ADHESIVE PROPEN (GAUZE/BANDAGES/DRESSINGS) ×8
DERMABOND ADVANCED (GAUZE/BANDAGES/DRESSINGS) ×4
DERMABOND ADVANCED .7 DNX12 (GAUZE/BANDAGES/DRESSINGS) ×4 IMPLANT
DERMABOND ADVANCED .7 DNX6 (GAUZE/BANDAGES/DRESSINGS) ×8 IMPLANT
DRAPE C-ARM 42X72 X-RAY (DRAPES) ×8 IMPLANT
DRAPE C-ARMOR (DRAPES) ×8 IMPLANT
DRAPE LAPAROTOMY 100X72X124 (DRAPES) ×8 IMPLANT
DRAPE POUCH INSTRU U-SHP 10X18 (DRAPES) ×8 IMPLANT
DRAPE SURG 17X23 STRL (DRAPES) ×4 IMPLANT
DRSG OPSITE POSTOP 3X4 (GAUZE/BANDAGES/DRESSINGS) ×4 IMPLANT
DRSG OPSITE POSTOP 4X10 (GAUZE/BANDAGES/DRESSINGS) ×8 IMPLANT
DRSG OPSITE POSTOP 4X6 (GAUZE/BANDAGES/DRESSINGS) ×4 IMPLANT
DURAPREP 26ML APPLICATOR (WOUND CARE) ×8 IMPLANT
ELECT REM PT RETURN 9FT ADLT (ELECTROSURGICAL) ×8
ELECTRODE REM PT RTRN 9FT ADLT (ELECTROSURGICAL) ×4 IMPLANT
GAUZE SPONGE 4X4 12PLY STRL (GAUZE/BANDAGES/DRESSINGS) ×4 IMPLANT
GAUZE SPONGE 4X4 16PLY XRAY LF (GAUZE/BANDAGES/DRESSINGS) IMPLANT
GLOVE BIO SURGEON STRL SZ8 (GLOVE) ×12 IMPLANT
GLOVE BIOGEL PI IND STRL 8 (GLOVE) ×4 IMPLANT
GLOVE BIOGEL PI IND STRL 8.5 (GLOVE) ×4 IMPLANT
GLOVE BIOGEL PI INDICATOR 8 (GLOVE) ×4
GLOVE BIOGEL PI INDICATOR 8.5 (GLOVE) ×4
GLOVE ECLIPSE 8.0 STRL XLNG CF (GLOVE) ×8 IMPLANT
GLOVE EXAM NITRILE LRG STRL (GLOVE) IMPLANT
GLOVE EXAM NITRILE MD LF STRL (GLOVE) IMPLANT
GLOVE EXAM NITRILE XL STR (GLOVE) IMPLANT
GLOVE EXAM NITRILE XS STR PU (GLOVE) IMPLANT
GOWN STRL REUS W/ TWL LRG LVL3 (GOWN DISPOSABLE) IMPLANT
GOWN STRL REUS W/ TWL XL LVL3 (GOWN DISPOSABLE) ×8 IMPLANT
GOWN STRL REUS W/TWL 2XL LVL3 (GOWN DISPOSABLE) ×8 IMPLANT
GOWN STRL REUS W/TWL LRG LVL3 (GOWN DISPOSABLE)
GOWN STRL REUS W/TWL XL LVL3 (GOWN DISPOSABLE) ×8
GUIDEWIRE NITINOL BEVEL TIP (WIRE) ×32 IMPLANT
IMPL COROENT XL 8X18X50 (Intraocular Lens) ×2 IMPLANT
IMPLANT COROENT LDTXL 10X18X50 (Intraocular Lens) ×8 IMPLANT
IMPLANT COROENT XL 8X18X50 (Intraocular Lens) ×4 IMPLANT
KIT BASIN OR (CUSTOM PROCEDURE TRAY) ×8 IMPLANT
KIT DILATOR XLIF 5 (KITS) ×2 IMPLANT
KIT INFUSE MEDIUM (Orthopedic Implant) ×4 IMPLANT
KIT POSITION SURG JACKSON T1 (MISCELLANEOUS) ×4 IMPLANT
KIT ROOM TURNOVER OR (KITS) ×4 IMPLANT
KIT SURGICAL ACCESS MAXCESS 4 (KITS) ×4 IMPLANT
KIT XLIF (KITS) ×2
MARKER SKIN DUAL TIP RULER LAB (MISCELLANEOUS) ×4 IMPLANT
MODULE NVM5 NEXT GEN EMG (NEEDLE) ×4 IMPLANT
NEEDLE HYPO 25X1 1.5 SAFETY (NEEDLE) ×4 IMPLANT
NEEDLE I PASS (NEEDLE) ×4 IMPLANT
NS IRRIG 1000ML POUR BTL (IV SOLUTION) ×8 IMPLANT
PACK LAMINECTOMY NEURO (CUSTOM PROCEDURE TRAY) ×8 IMPLANT
PAD ARMBOARD 7.5X6 YLW CONV (MISCELLANEOUS) ×16 IMPLANT
PATTIES SURGICAL .5 X.5 (GAUZE/BANDAGES/DRESSINGS) IMPLANT
PATTIES SURGICAL .5 X1 (DISPOSABLE) IMPLANT
PATTIES SURGICAL 1X1 (DISPOSABLE) IMPLANT
PUTTY BONE ATTRAX 10CC STRIP (Putty) ×4 IMPLANT
ROD RELINE MAS LORD 5.5X100MM (Rod) ×8 IMPLANT
SCREW LOCK RELINE 5.5 TULIP (Screw) ×32 IMPLANT
SCREW MAS RELINE 6.5X45 POLY (Screw) ×24 IMPLANT
SCREW RELINE MAS POLY 5.5X45MM (Screw) ×8 IMPLANT
SPONGE LAP 4X18 X RAY DECT (DISPOSABLE) IMPLANT
STAPLER SKIN PROX WIDE 3.9 (STAPLE) ×4 IMPLANT
STRIP CLOSURE SKIN 1/2X4 (GAUZE/BANDAGES/DRESSINGS) ×3 IMPLANT
SUT VIC AB 1 CT1 18XBRD ANBCTR (SUTURE) ×10 IMPLANT
SUT VIC AB 1 CT1 8-18 (SUTURE) ×10
SUT VIC AB 2-0 CT1 18 (SUTURE) ×20 IMPLANT
SUT VIC AB 3-0 SH 8-18 (SUTURE) ×20 IMPLANT
SYR INSULIN 1ML 31GX6 SAFETY (SYRINGE) IMPLANT
TAPE CLOTH 3X10 TAN LF (GAUZE/BANDAGES/DRESSINGS) ×8 IMPLANT
TOWEL OR 17X24 6PK STRL BLUE (TOWEL DISPOSABLE) ×8 IMPLANT
TOWEL OR 17X26 10 PK STRL BLUE (TOWEL DISPOSABLE) ×8 IMPLANT
TRAY FOLEY W/METER SILVER 14FR (SET/KITS/TRAYS/PACK) ×4 IMPLANT
WATER STERILE IRR 1000ML POUR (IV SOLUTION) ×8 IMPLANT

## 2015-08-13 NOTE — Interval H&P Note (Signed)
History and Physical Interval Note:  08/13/2015 7:29 AM  Sheri Reid  has presented today for surgery, with the diagnosis of Spondylolisthesis, Lumbar region  The various methods of treatment have been discussed with the patient and family. After consideration of risks, benefits and other options for treatment, the patient has consented to  Procedure(s) with comments: Left Access L2-3 L3-4 L4-5 Anterolateral interbody fusion with percutaneous pedicle screws/Possible decompressive lumbar laminectomy L2-L5 (Left) - Left Access L2-3 L3-4 L4-5 Anterolateral interbody fusion with percutaneous pedicle screws/Possible decompressive lumbar laminectomy L2-L5 Left Access L2-3 L3-4 L4-5 Anterolateral interbody fusion with percutaneous pedicle screws/Possible decompressive lumbar laminectomy L2-L5 (N/A) - Left Access L2-3 L3-4 L4-5 Anterolateral interbody fusion with percutaneous pedicle screws/Possible decompressive lumbar laminectomy L2-L5 as a surgical intervention .  The patient's history has been reviewed, patient examined, no change in status, stable for surgery.  I have reviewed the patient's chart and labs.  Questions were answered to the patient's satisfaction.     [name]

## 2015-08-13 NOTE — Anesthesia Postprocedure Evaluation (Signed)
Anesthesia Post Note  Patient: Sheri Reid  Procedure(s) Performed: Procedure(s) (LRB): Left Access L2-3 L3-4 L4-5 Anterolateral interbody fusion  (Left) L2-3 L3-4 L4-5 Anterolateral interbody fusion with percutaneous pedicle screws (N/A)  Patient location during evaluation: PACU Anesthesia Type: General Level of consciousness: awake and alert Pain management: pain level controlled Vital Signs Assessment: post-procedure vital signs reviewed and stable Respiratory status: spontaneous breathing Cardiovascular status: blood pressure returned to baseline Anesthetic complications: no    Last Vitals:  Filed Vitals:   08/13/15 1509 08/13/15 1534  BP: 154/74 165/81  Pulse: 97 109  Temp:  36.6 C  Resp: 9 12    Last Pain:  Filed Vitals:   08/13/15 1537  PainSc: 10-Worst pain ever                 Tiajuana Amass

## 2015-08-13 NOTE — Progress Notes (Signed)
Utilization review completed.  

## 2015-08-13 NOTE — H&P (Signed)
Patient ID:   [id number] Patient: Sheri Reid  Date of Birth: [birth year] Visit Type: Office Visit   Date: 07/21/2015 01:30 PM Provider: Marchia Meiers. Vertell Limber MD   This 75 year old female presents for back pain and Leg pain.  History of Present Illness: 1.  back pain  2.  Leg pain  Alyssah Algeo is a 75 year old retired woman with low back and left leg pain which she describes as 10 out of 10 in severity and is causing her to cry as she is unable to get any relief.  His numbness in her left leg.  She says that it has been worsening over the last 3 months and is extremely severe.  She has been to a chiropractor and she felt that this increased her pain and a massage did not give her much relief.  She has had multiple cortisone injections without significant relief.  She is currently taking oxycodone 5/320 5/2 a tablet at a time and has also been using tramadol 3-4 tablets of 50 mg daily.  The patient has a history of ulcerative colitis and insulin-dependent diabetes mellitus.  She has involving her right hand and hip.  She has had prior right total hip replacement in 2007 and on the left in 2010 and has had right shoulder replacement in 2013.  The patient is 5 feet 5 inches tall and 155 pounds.  She drinks one alcohol beverage daily and denies tobacco use.  She says that she has had a recent bone density test and that this was acceptable.  Currently the patient complains of pain into the left side of her waist into her knee.  She denies right leg pain.  She says that her low back pain is severe but that her left leg pain is a close second.  She says that she is weak going up and down steps.  Plain radiographs demonstrate scoliosis with dextroconvex curvature causing concave compression of neural elements on the left at the L2-3 and L3 L4 and L4 L5 levels.  MRI of the lumbar spine demonstrates severe spinal stenosis at L4 L5 with spondylolisthesis along with retrolisthesis of L2 and L3 and of L3  and L4.        PAST MEDICAL/SURGICAL HISTORY   (Detailed)  Disease/disorder Onset Date Management Date Comments    Hip replacement 2007     Hip replacement 2010     Shoulder replacement 2013   Anxiety      Arthritis      Depression      Diabetes mellitus         Family History  (Detailed) Relationship Family Member Name Deceased Age at Death Condition Onset Age Cause of Death  Father    Diabetes mellitus  N    SOCIAL HISTORY  (Detailed) Tobacco use reviewed. Preferred language is Unknown.   Smoking status: Never smoker.  SMOKING STATUS Use Status Type Smoking Status Usage Per Day Years Used Total Pack Years  no/never  Never smoker       HOME ENVIRONMENT/SAFETY The patient has not fallen in the last year.        MEDICATIONS(added, continued or stopped this visit): Started Medication Directions Instruction Stopped   Apriso 0.375 gram capsule,extended release Take 1 capsule twice daily     biotin 5061mg ORAL Take once daily     Co Q-10 200 mg capsule Take once daily     cranberry 500 mg capsule Take once daily     Lantus Solostar 100  unit/mL (3 mL) subcutaneous insulin pen inject 44 unit by subcutaneous route  every day     levocetirizine 5 mg tablet take 1 tablet by oral route  every day in the evening     lisinopril 10 mg tablet take 1 tablet by oral route  every day     metformin 1,000 mg tablet take 1 tablet by oral route 2 times every day    07/21/2015 Norco 5 mg-325 mg tablet take 1-2 tablet by oral route  every 6 hours as needed for pain     Novolog Sliding scale     probiotic Take once daily    07/21/2015 tizanidine 2 mg tablet Take 1 po TID prn spasm     triazolam 0.25 mg tablet take 0.5 tablet by oral route  every day at bedtime as needed     turmeric root extract Take once daily     victoza Take as directed     Vitamin B-12 Take as directed     Vitamin C 1,000 mg tablet Take once daily     Vitamin D2 50,000 unit capsule take 1 capsule by oral  route  every 2 weeks       ALLERGIES: Ingredient Reaction Medication Name Comment  NO KNOWN ALLERGIES     No known allergies.    Vitals Date Temp F BP Pulse Ht In Wt Lb BMI BSA Pain Score  07/21/2015  169/76 99 65 149 24.79  9/10     PHYSICAL EXAM General Level of Distress: no acute distress Overall Appearance: normal  Head and Face  Right Left  Fundoscopic Exam:  normal normal    Cardiovascular Cardiac: regular rate and rhythm without murmur  Right Left  Carotid Pulses: normal normal  Respiratory Lungs: clear to auscultation  Neurological Orientation: normal Recent and Remote Memory: normal Attention Span and Concentration:   normal Language: normal Fund of Knowledge: normal  Right Left Sensation: normal normal Upper Extremity Coordination: normal normal  Lower Extremity Coordination: normal normal  Musculoskeletal Gait and Station: normal  Right Left Upper Extremity Muscle Strength: normal normal Lower Extremity Muscle Strength: normal normal Upper Extremity Muscle Tone:  normal normal Lower Extremity Muscle Tone: normal normal  Motor Strength Upper and lower extremity motor strength was tested in the clinically pertinent muscles.     Deep Tendon Reflexes  Right Left Biceps: normal normal Triceps: normal normal Brachiloradialis: normal normal Patellar: normal normal Achilles: normal normal  Sensory Sensation was tested at L1 to S1.   Cranial Nerves II. Optic Nerve/Visual Fields: normal III. Oculomotor: normal IV. Trochlear: normal V. Trigeminal: normal VI. Abducens: normal VII. Facial: normal VIII. Acoustic/Vestibular: normal IX. Glossopharyngeal: normal X. Vagus: normal XI. Spinal Accessory: normal XII. Hypoglossal: normal  Motor and other Tests Lhermittes: negative Rhomberg: negative Pronator drift: absent     Right Left Hoffman's: normal normal Clonus: normal normal Babinski: normal normal SLR: negative positive at 30  degrees Patrick's Corky Sox): negative negative Toe Walk: normal normal Toe Lift: normal normal Heel Walk: normal normal SI Joint: nontender nontender   Additional Findings:  Patient has pain at the lumbosacral junction and left sciatic notch discomfort to palpation.  She has decreased ability to squat on the left leg compared to the right.    IMPRESSION The patient has spinal stenosis and spondylolisthesis with lumbar scoliosis.  She has a mental stenosis affecting the left L3 and L4 nerve roots and central stenosis affecting the L4 L5 level.  I have recommended surgical intervention given  the severity of her pain complaints.  Completed Orders (this encounter) Order Details Reason Side Interpretation Result Initial Treatment Date Region  Scoliosis- AP/Lat      07/21/2015   Lumbar Spine- AP/Lat/Flex/Ex      07/21/2015   Hypertension education Follow up with primary care physician.         Assessment/Plan # Detail Type Description   1. Assessment Spondylolisthesis, lumbar region (M43.16).       2. Assessment Spinal stenosis of lumbar region (M48.06).       3. Assessment Radiculopathy, lumbar region (M54.16).       4. Assessment Low back pain, unspecified back pain laterality, with sciatica presence unspecified (M54.5).       5. Assessment Disc displacement, lumbar (M51.26).       6. Assessment Idiopathic scoliosis of lumbar region (M41.26).       7. Assessment Essential (primary) hypertension (I10).         Pain Assessment/Treatment Pain Scale: 9/10. Method: Numeric Pain Intensity Scale. Location: back/left leg. Onset: 04/20/2015. Duration: varies. Quality: discomforting. Pain Assessment/Treatment follow-up plan of care: Patient is taking medications as prescribed..  Fall Risk Plan The patient has not fallen in the last year.  Surgery will consist of left-sided XL IMF or seizure at L2-3, L3 L4, L4 L5 levels with percutaneous pedicle screw fixation.  She may require  decompressive lumbar laminectomy L 4 through 5 levels I am not able to adequately decompress her spinal canal indirectly.  This will be performed at [facility].  Risks and benefits were discussed in detail with the patient and she wished to proceed with surgery.  Surgery is been tentatively scheduled for August 13, 2015.  Orders: Diagnostic Procedures: Assessment Procedure  M43.16  L2-L3 - L3-L4 - L4-L5 left access anterolateral interbody fusion with percutaneous pedicle screws and rods, possible decompressive lumbar laminectomy L2-L5  M43.16 Lumbar Spine- AP/Lat  M43.16 Lumbar Spine- AP/Lat/Flex/Ex  M48.06 Lumbar Spine- AP/Lat/Flex/Ex  M48.06 Scoliosis- AP/Lat  M54.16 Scoliosis- AP/Lat  Instruction(s)/Education: Assessment Instruction  I10 Hypertension education    MEDICATIONS PRESCRIBED TODAY    Rx Quantity Refills  TIZANIDINE HCL 2 mg  60 0  NORCO 5 mg-325 mg  60 0            Provider:  Marchia Meiers. Vertell Limber MD  07/22/2015 04:52 PM Dictation edited by: Marchia Meiers. Vertell Limber    CC Providers: Erline Levine MD [address] [place], [place] [address]              Electronically signed by Marchia Meiers. Vertell Limber MD on 07/22/2015 04:52 PM

## 2015-08-13 NOTE — Anesthesia Preprocedure Evaluation (Signed)
Anesthesia Evaluation  Patient identified by MRN, date of birth, ID band Patient awake    Reviewed: Allergy & Precautions, NPO status , Patient's Chart, lab work & pertinent test results  Airway Mallampati: III  TM Distance: >3 FB Neck ROM: Full    Dental  (+) Dental Advisory Given   Pulmonary former smoker,    breath sounds clear to auscultation       Cardiovascular negative cardio ROS   Rhythm:Regular Rate:Normal  Stress test in 2013- no evidence of ischemia, normal LVEF and response to stress.   Neuro/Psych    GI/Hepatic Neg liver ROS, GERD  Medicated,  Endo/Other  diabetes, Type 2  Renal/GU negative Renal ROS     Musculoskeletal  (+) Arthritis ,   Abdominal   Peds  Hematology negative hematology ROS (+)   Anesthesia Other Findings   Reproductive/Obstetrics                             Lab Results  Component Value Date   WBC 9.4 08/07/2015   HGB 14.5 08/07/2015   HCT 42.5 08/07/2015   MCV 93.4 08/07/2015   PLT 237 08/07/2015   Lab Results  Component Value Date   CREATININE 0.98 08/07/2015   BUN 19 08/07/2015   NA 137 08/07/2015   K 4.2 08/07/2015   CL 104 08/07/2015   CO2 22 08/07/2015    Anesthesia Physical Anesthesia Plan  ASA: III  Anesthesia Plan: General   Post-op Pain Management:    Induction: Intravenous  Airway Management Planned: Oral ETT  Additional Equipment:   Intra-op Plan:   Post-operative Plan: Extubation in OR  Informed Consent: I have reviewed the patients History and Physical, chart, labs and discussed the procedure including the risks, benefits and alternatives for the proposed anesthesia with the patient or authorized representative who has indicated his/her understanding and acceptance.   Dental advisory given  Plan Discussed with: CRNA  Anesthesia Plan Comments:         Anesthesia Quick Evaluation

## 2015-08-13 NOTE — Brief Op Note (Signed)
08/13/2015  1:23 PM  PATIENT:  Sheri Reid  75 y.o. female  PRE-OPERATIVE DIAGNOSIS:  Spondylolisthesis, Lumbar region, scoliosis, stenosis, herniated disc, radiculopathy L 23, L 34, L 45 levels  POST-OPERATIVE DIAGNOSIS: Spondylolisthesis, Lumbar region, scoliosis, stenosis, herniated disc, radiculopathy L 23, L 34, L 45 levels  PROCEDURE:  Procedure(s) with comments: Left Access L2-3 L3-4 L4-5 Anterolateral interbody fusion  (Left) - Left Access L2-3 L3-4 L4-5 Anterolateral interbody fusion  L2-3 L3-4 L4-5 Anterolateral interbody fusion with percutaneous pedicle screws (N/A) - L2-3 L3-4 L4-5 Anterolateral interbody fusion with percutaneous pedicle screws with PEEK cages, allograft  SURGEON:  Surgeon(s) and Role:    * Erline Levine, MD - Primary    * Newman Pies, MD - Assisting  PHYSICIAN ASSISTANT:   ASSISTANTS: Poteat, RN   ANESTHESIA:   general  EBL:  Total I/O In: 2000 [I.V.:2000] Out: 975 [Urine:940; Blood:35]  BLOOD ADMINISTERED:none  DRAINS: none   LOCAL MEDICATIONS USED:  MARCAINE    and LIDOCAINE   SPECIMEN:  No Specimen  DISPOSITION OF SPECIMEN:  N/A  COUNTS:  YES  TOURNIQUET:  * No tourniquets in log *  DICTATION: DICTATION: Patient is a 75 year old with 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 It was elected to take her to surgery for anterolateral decompression and posterior pedicle screw fixation.  The patient has an auto-fusion at L 45.  Procedure: Patient was brought to the operating room and placed in a right lateral decubitus position on the operative table and using orthogonally projected C-arm fluoroscopy the patient was placed so that the L2-3 L3-4 and L 45 levels were visualized in AP and lateral plane. The patient was then taped into position. The table was flexed so as to expose the L 45 level as the patient has a high iliac crest. Skin was marked along with a posterior finger dissection incision. Her flank was then  prepped and draped in usual sterile fashion and incisions were made sequentially at L 45,  L3-4 and L2-3 levels. Posterior finger dissection was made to enter the retroperitoneal space and then subsequently the probe was inserted into the psoas muscle from the left side initially at the L 45 level. After mapping the neural elements were able to dock the probe per the midpoint of this vertebral level and without indications electrically of too close proximity to the neural tissues. Subsequently the self-retaining tractor was.after sequential dilators were utilized the shim was employed and the interspace was cleared of psoas muscle and then incised. A thorough discectomy was performed. Angled instruments were used to clear the interspace of disc material. An anterior entry with posterior trajectory was performed to avoid neural elements.   After thorough discectomy was performed and this was performed using AP and lateral fluoroscopy a 10 lordotic by 50 x 18 mm implant was packed with BMP and Attrax. This was tamped into position using the slides and its position was confirmed on AP and lateral fluoroscopy. Subsequently exposure was performed at the L3-4 level and similar dissection was performed with locking of the self-retaining retractor. At this level were able to place a 10 lordotic  by 18 x 32m implant packed in a similar fashion. At the L2-3 level were able to place an 8 mm standard by 50 x 18 mm implant packed in a similar fashion. Hemostasis was assured the wounds were irrigated and closed with interrupted Vicryl sutures.  Sterile occlusive dressings were placed. Retractor times were:  L 45: 23 minutes;  L 34: 18 minutes; L 23: 14 minutes.   Patient was then turned into a prone position on the operating table on chest rolls and using AP and lateral fluoroscopy throughout this portion of the procedure, pedicle screws were placed using Reline Nuvasive cannulated percutaneous screws. 2 screws were placed  at L2 and (5.5 x 45 mm) and 2 at L3 (6.5 x 45), and two at L4 of a similar size,  2 at L5 of a similar size. 100 mm rod was then affixed to the screw heads do a separate stab incision and locked down on the screws on the left and 100 mm rod on the right. All connections were then torqued and the Towers were disassembled. The wounds were irrigated and then closed with 1, 2-0 and 3-0 Vicryl stitches. Sterile occlusive dressing was placed with Dermabond. Long-acting Marcaine was injected. The patient was then extubated in the operating room and taken to recovery in stable and satisfactory condition having tolerated her operation well. Counts were correct at the end of the case.  Pelvic Parameters:  Preop:  PI 60; LL 65; PI-LL -5  Postop:  LL 65;  PI-LL:  -5.  PLAN OF CARE: Admit to inpatient   PATIENT DISPOSITION:  PACU - hemodynamically stable.   Delay start of Pharmacological VTE agent (>24hrs) due to surgical blood loss or risk of bleeding: yes

## 2015-08-13 NOTE — Progress Notes (Signed)
Awake, alert, conversant.  MAEW with good strength.  Doing well.

## 2015-08-13 NOTE — Anesthesia Procedure Notes (Addendum)
Procedure Name: Intubation Date/Time: 08/13/2015 7:45 AM Performed by: Bethel Born Pre-anesthesia Checklist: Patient identified, Timeout performed, Emergency Drugs available, Suction available and Patient being monitored Patient Re-evaluated:Patient Re-evaluated prior to inductionOxygen Delivery Method: Circle system utilized Preoxygenation: Pre-oxygenation with 100% oxygen Intubation Type: IV induction Ventilation: Mask ventilation without difficulty Laryngoscope Size: Miller and 2 Grade View: Grade III Tube type: Oral Tube size: 7.0 mm Number of attempts: 2 Airway Equipment and Method: Bougie stylet Placement Confirmation: breath sounds checked- equal and bilateral,  ETT inserted through vocal cords under direct vision and positive ETCO2 Secured at: 23 cm Tube secured with: Tape Dental Injury: Teeth and Oropharynx as per pre-operative assessment  Difficulty Due To: Difficulty was unanticipated and Difficult Airway- due to anterior larynx Comments: DL x1 with Mil 2, unable to pass tube. Anterior view.  DL x2 with Mil 2 and bougie used.  EBBS VSS.   Performed by: Bethel Born

## 2015-08-13 NOTE — Transfer of Care (Signed)
Immediate Anesthesia Transfer of Care Note  Patient: Sheri Reid  Procedure(s) Performed: Procedure(s) with comments: Left Access L2-3 L3-4 L4-5 Anterolateral interbody fusion  (Left) - Left Access L2-3 L3-4 L4-5 Anterolateral interbody fusion  L2-3 L3-4 L4-5 Anterolateral interbody fusion with percutaneous pedicle screws (N/A) - L2-3 L3-4 L4-5 Anterolateral interbody fusion with percutaneous pedicle screws  Patient Location: PACU  Anesthesia Type:General  Level of Consciousness: awake, oriented and patient cooperative  Airway & Oxygen Therapy: Patient Spontanous Breathing and Patient connected to nasal cannula oxygen  Post-op Assessment: Report given to RN and Post -op Vital signs reviewed and stable  Post vital signs: Reviewed and stable  Last Vitals:  Filed Vitals:   08/13/15 0629  BP: 161/75  Pulse: 74  Temp: 36.2 C  Resp: 16    Complications: No apparent anesthesia complications

## 2015-08-13 NOTE — Op Note (Signed)
08/13/2015  1:23 PM  PATIENT:  Sheri Reid  75 y.o. female  PRE-OPERATIVE DIAGNOSIS:  Spondylolisthesis, Lumbar region, scoliosis, stenosis, herniated disc, radiculopathy L 23, L 34, L 45 levels  POST-OPERATIVE DIAGNOSIS: Spondylolisthesis, Lumbar region, scoliosis, stenosis, herniated disc, radiculopathy L 23, L 34, L 45 levels  PROCEDURE:  Procedure(s) with comments: Left Access L2-3 L3-4 L4-5 Anterolateral interbody fusion  (Left) - Left Access L2-3 L3-4 L4-5 Anterolateral interbody fusion  L2-3 L3-4 L4-5 Anterolateral interbody fusion with percutaneous pedicle screws (N/A) - L2-3 L3-4 L4-5 Anterolateral interbody fusion with percutaneous pedicle screws with PEEK cages, allograft  SURGEON:  Surgeon(s) and Role:    * Erline Levine, MD - Primary    * Newman Pies, MD - Assisting  PHYSICIAN ASSISTANT:   ASSISTANTS: Poteat, RN   ANESTHESIA:   general  EBL:  Total I/O In: 2000 [I.V.:2000] Out: 975 [Urine:940; Blood:35]  BLOOD ADMINISTERED:none  DRAINS: none   LOCAL MEDICATIONS USED:  MARCAINE    and LIDOCAINE   SPECIMEN:  No Specimen  DISPOSITION OF SPECIMEN:  N/A  COUNTS:  YES  TOURNIQUET:  * No tourniquets in log *  DICTATION: DICTATION: Patient is a 75 year old with 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 It was elected to take her to surgery for anterolateral decompression and posterior pedicle screw fixation.  The patient has an auto-fusion at L 45.  Procedure: Patient was brought to the operating room and placed in a right lateral decubitus position on the operative table and using orthogonally projected C-arm fluoroscopy the patient was placed so that the L2-3 L3-4 and L 45 levels were visualized in AP and lateral plane. The patient was then taped into position. The table was flexed so as to expose the L 45 level as the patient has a high iliac crest. Skin was marked along with a posterior finger dissection incision. Her flank was then  prepped and draped in usual sterile fashion and incisions were made sequentially at L 45,  L3-4 and L2-3 levels. Posterior finger dissection was made to enter the retroperitoneal space and then subsequently the probe was inserted into the psoas muscle from the left side initially at the L 45 level. After mapping the neural elements were able to dock the probe per the midpoint of this vertebral level and without indications electrically of too close proximity to the neural tissues. Subsequently the self-retaining tractor was.after sequential dilators were utilized the shim was employed and the interspace was cleared of psoas muscle and then incised. A thorough discectomy was performed. Angled instruments were used to clear the interspace of disc material. An anterior entry with posterior trajectory was performed to avoid neural elements.   After thorough discectomy was performed and this was performed using AP and lateral fluoroscopy a 10 lordotic by 50 x 18 mm implant was packed with BMP and Attrax. This was tamped into position using the slides and its position was confirmed on AP and lateral fluoroscopy. Subsequently exposure was performed at the L3-4 level and similar dissection was performed with locking of the self-retaining retractor. At this level were able to place a 10 lordotic  by 18 x 86m implant packed in a similar fashion. At the L2-3 level were able to place an 8 mm standard by 50 x 18 mm implant packed in a similar fashion. Hemostasis was assured the wounds were irrigated and closed with interrupted Vicryl sutures.  Sterile occlusive dressings were placed. Retractor times were:  L 45: 23 minutes;  L 34: 18 minutes; L 23: 14 minutes.   Patient was then turned into a prone position on the operating table on chest rolls and using AP and lateral fluoroscopy throughout this portion of the procedure, pedicle screws were placed using Reline Nuvasive cannulated percutaneous screws. 2 screws were placed  at L2 and (5.5 x 45 mm) and 2 at L3 (6.5 x 45), and two at L4 of a similar size,  2 at L5 of a similar size. 100 mm rod was then affixed to the screw heads do a separate stab incision and locked down on the screws on the left and 100 mm rod on the right. All connections were then torqued and the Towers were disassembled. The wounds were irrigated and then closed with 1, 2-0 and 3-0 Vicryl stitches. Sterile occlusive dressing was placed with Dermabond. Long-acting Marcaine was injected. The patient was then extubated in the operating room and taken to recovery in stable and satisfactory condition having tolerated her operation well. Counts were correct at the end of the case.  Pelvic Parameters:  Preop:  PI 60; LL 65; PI-LL -5  Postop:  LL 65;  PI-LL:  -5.  PLAN OF CARE: Admit to inpatient   PATIENT DISPOSITION:  PACU - hemodynamically stable.   Delay start of Pharmacological VTE agent (>24hrs) due to surgical blood loss or risk of bleeding: yes

## 2015-08-14 ENCOUNTER — Encounter (HOSPITAL_COMMUNITY): Payer: Self-pay | Admitting: Neurosurgery

## 2015-08-14 LAB — GLUCOSE, CAPILLARY
GLUCOSE-CAPILLARY: 299 mg/dL — AB (ref 65–99)
GLUCOSE-CAPILLARY: 307 mg/dL — AB (ref 65–99)
GLUCOSE-CAPILLARY: 279 mg/dL — AB (ref 65–99)
Glucose-Capillary: 219 mg/dL — ABNORMAL HIGH (ref 65–99)

## 2015-08-14 MED ORDER — PANTOPRAZOLE SODIUM 40 MG PO TBEC
40.0000 mg | DELAYED_RELEASE_TABLET | Freq: Every day | ORAL | Status: DC
  Administered 2015-08-14 – 2015-08-17 (×3): 40 mg via ORAL
  Filled 2015-08-14 (×5): qty 1

## 2015-08-14 NOTE — Progress Notes (Signed)
Heard patient screaming out. When i checked in with her she was crying and claims that she has been calling out but nobody was answering. Patient apparently was hitting the wrong button on the call bell. Patient was reoriented to the remote call bell. Pain medicine was administered to patient as requested. Will continue to monitor

## 2015-08-14 NOTE — Progress Notes (Signed)
Subjective: Patient reports "I hurt but it's a different pain than before. My legs don't hurt."  Objective: Vital signs in last 24 hours: Temp:  [97.7 F (36.5 C)-99 F (37.2 C)] 99 F (37.2 C) (03/03 0554) Pulse Rate:  [91-122] 102 (03/03 0554) Resp:  [7-20] 18 (03/03 0554) BP: (133-165)/(63-93) 149/68 mmHg (03/03 0554) SpO2:  [97 %-100 %] 97 % (03/03 0554)  Intake/Output from previous day: 03/02 0701 - 03/03 0700 In: 2602.5 [I.V.:2602.5] Out: 2275 [Urine:2240; Blood:35] Intake/Output this shift:    Alert, conversant. Reports lumbar pain as expected, denies leg pain. incisions without erythema, swelling, or drainage. Honeycomb intact over Dermabond. Good strength BLE, but not yet out of bed.  Lab Results: No results for input(s): WBC, HGB, HCT, PLT in the last 72 hours. BMET No results for input(s): NA, K, CL, CO2, GLUCOSE, BUN, CREATININE, CALCIUM in the last 72 hours.  Studies/Results: Dg Lumbar Spine Complete  08/13/2015  CLINICAL DATA:  Post posterior lumbar fusion L2, L3, L4 and L5 level. EXAM: DG C-ARM GT 120 MIN; LUMBAR SPINE - COMPLETE 4+ VIEW CONTRAST:  None FLUOROSCOPY TIME:  : Fluoroscopy Time (in minutes and seconds):  7 minutes 18 seconds Number of Acquired Images:  4 COMPARISON:  None. FINDINGS: Four views of the lumbar spine submitted. There is posterior metallic fusion with metallic rods and transpedicular screws at L2-L3, L3-L4, L4-L5 level. There is anatomic alignment. Postsurgical disc spacers are noted at L2-L3, L3-L4 and L4-L5 disc space level. Atherosclerotic calcifications of abdominal aorta. IMPRESSION: Posterior metallic fusion with transpedicular screws and rods at L2-L5 level. There is anatomic alignment. Please see the operative report. Electronically Signed   By: Lahoma Crocker M.D.   On: 08/13/2015 12:29   Dg C-arm Gt 120 Min  08/13/2015  CLINICAL DATA:  Post posterior lumbar fusion L2, L3, L4 and L5 level. EXAM: DG C-ARM GT 120 MIN; LUMBAR SPINE - COMPLETE  4+ VIEW CONTRAST:  None FLUOROSCOPY TIME:  : Fluoroscopy Time (in minutes and seconds):  7 minutes 18 seconds Number of Acquired Images:  4 COMPARISON:  None. FINDINGS: Four views of the lumbar spine submitted. There is posterior metallic fusion with metallic rods and transpedicular screws at L2-L3, L3-L4, L4-L5 level. There is anatomic alignment. Postsurgical disc spacers are noted at L2-L3, L3-L4 and L4-L5 disc space level. Atherosclerotic calcifications of abdominal aorta. IMPRESSION: Posterior metallic fusion with transpedicular screws and rods at L2-L5 level. There is anatomic alignment. Please see the operative report. Electronically Signed   By: Lahoma Crocker M.D.   On: 08/13/2015 12:29    Assessment/Plan: Improving   LOS: 1 day  Mobilize in LSO with PT.    Verdis Prime 08/14/2015, 9:10 AM LATE ENTRY for ~0745 visit bdp

## 2015-08-14 NOTE — Evaluation (Signed)
Physical Therapy Evaluation Patient Details Name: Sheri Reid MRN: [id number] DOB: 1940/07/04 Today's Date: 08/14/2015   History of Present Illness  Pt is a 75 y/o female who presents s/p L2-L5 anterior lateral lumbar fusion on 08/14/15.  Clinical Impression  Pt admitted with above diagnosis. Pt currently with functional limitations due to the deficits listed below (see PT Problem List). At the time of PT eval pt was able to perform transfers and ambulation with assistance for balance/support. Chair follow utilized during gait training. Pt will benefit from skilled PT to increase their independence and safety with mobility to allow discharge to the venue listed below.       Follow Up Recommendations Home health PT;Supervision for mobility/OOB    Equipment Recommendations  Rolling walker with 5" wheels;3in1 (PT)    Recommendations for Other Services       Precautions / Restrictions Precautions Precautions: Fall;Back Precaution Booklet Issued: No Precaution Comments: Reviewed precautions verbally, however pt would benefit from review with handout.  Required Braces or Orthoses: Spinal Brace Spinal Brace: Lumbar corset;Applied in sitting position Restrictions Weight Bearing Restrictions: No      Mobility  Bed Mobility Overal bed mobility: Needs Assistance Bed Mobility: Rolling;Sidelying to Sit Rolling: Min assist Sidelying to sit: Mod assist       General bed mobility comments: VC's for step-by-step technique for log roll. Assist required for LE's off EOB and trunk elevation to full sitting position.   Transfers Overall transfer level: Needs assistance Equipment used: Rolling walker (2 wheeled) Transfers: Sit to/from Stand Sit to Stand: Min assist         General transfer comment: Assist to power-up to full standing position. Increased time to gain/maintain balance.   Ambulation/Gait Ambulation/Gait assistance: Min assist Ambulation Distance (Feet): 50  Feet Assistive device: Rolling walker (2 wheeled) Gait Pattern/deviations: Step-through pattern;Decreased stride length;Trunk flexed Gait velocity: Decreased Gait velocity interpretation: Below normal speed for age/gender General Gait Details: VC's for improved posture and increased step/stride length.   Stairs            Wheelchair Mobility    Modified Rankin (Stroke Patients Only)       Balance Overall balance assessment: Needs assistance Sitting-balance support: Feet supported;No upper extremity supported Sitting balance-Leahy Scale: Poor     Standing balance support: Bilateral upper extremity supported;During functional activity Standing balance-Leahy Scale: Poor                               Pertinent Vitals/Pain Pain Assessment: 0-10 Pain Score: 6  Pain Location: In back Pain Descriptors / Indicators: Operative site guarding Pain Intervention(s): Limited activity within patient's tolerance;Monitored during session;Repositioned    Home Living Family/patient expects to be discharged to:: Private residence Living Arrangements: Alone Available Help at Discharge: Family;Available 24 hours/day Type of Home: House Home Access: Level entry     Home Layout: One level Home Equipment: Cane - single point Additional Comments: Son works for a [employer] and can get any equipment she needs    Prior Function Level of Independence: Independent               Hand Dominance   Dominant Hand: Right    Extremity/Trunk Assessment   Upper Extremity Assessment: Defer to OT evaluation           Lower Extremity Assessment: Generalized weakness      Cervical / Trunk Assessment: Normal  Communication   Communication: No  difficulties  Cognition Arousal/Alertness: Awake/alert Behavior During Therapy: WFL for tasks assessed/performed Overall Cognitive Status: Within Functional Limits for tasks assessed                       General Comments      Exercises        Assessment/Plan    PT Assessment Patient needs continued PT services  PT Diagnosis Difficulty walking;Acute pain   PT Problem List Decreased strength;Decreased range of motion;Decreased activity tolerance;Decreased balance;Decreased mobility;Decreased knowledge of use of DME;Decreased safety awareness;Decreased knowledge of precautions;Pain  PT Treatment Interventions DME instruction;Gait training;Stair training;Functional mobility training;Therapeutic activities;Therapeutic exercise;Neuromuscular re-education;Patient/family education   PT Goals (Current goals can be found in the Care Plan section) Acute Rehab PT Goals Patient Stated Goal: Decrease pain PT Goal Formulation: With patient/family Time For Goal Achievement: 08/21/15 Potential to Achieve Goals: Good    Frequency Min 5X/week   Barriers to discharge        Co-evaluation               End of Session Equipment Utilized During Treatment: Gait belt;Back brace Activity Tolerance: Patient limited by fatigue Patient left: in chair;with call bell/phone within reach;with chair alarm set;with family/visitor present Nurse Communication: Mobility status         Time: 8757-9728 PT Time Calculation (min) (ACUTE ONLY): 31 min   Charges:   PT Evaluation $PT Eval Moderate Complexity: 1 Procedure PT Treatments $Gait Training: 8-22 mins   PT G Codes:        Rolinda Roan 2015-09-06, 12:41 PM   Rolinda Roan, PT, DPT Acute Rehabilitation Services Pager: [phone]

## 2015-08-14 NOTE — Evaluation (Signed)
Occupational Therapy Evaluation Patient Details Name: Sheri Reid MRN: [id number] DOB: 08-Sep-1940 Today's Date: 08/14/2015    History of Present Illness Pt is a 75 y/o female who presents s/p L2-L5 anterior lateral lumbar fusion on 08/14/15.   Clinical Impression   Pt reports she was independent with ADLs and mobility PTA. Currently pt is overall min assist for stand pivot transfers to [facility]; limited secondary to pain and urinary urgency. Pt min assist for UB ADLs and max assist for LB ADLs at this time. Pt impulsive with transfers; not following therapists verbal instructions. Pt refusing to wear back brace secondary to pain; educated pt on importance to wear brace when OOB. Pt planning to d/c home with 24/7 supervision from her daughter initially. Recommending HHOT for follow up in order to maximize independence and safety with ADLs and functional mobility upon return home. Pt would benefit from continued skilled OT to address established goals.    Follow Up Recommendations  Home health OT;Supervision/Assistance - 24 hour    Equipment Recommendations  3 in 1 bedside comode;Tub/shower seat;Other (comment) (Adaptive equipment)    Recommendations for Other Services       Precautions / Restrictions Precautions Precautions: Fall;Back Precaution Comments: Reviewed precautions with pt Required Braces or Orthoses: Spinal Brace Spinal Brace: Lumbar corset;Applied in sitting position (Pt refusing to wear brace secondary to pain) Restrictions Weight Bearing Restrictions: No      Mobility Bed Mobility Overal bed mobility: Needs Assistance Bed Mobility: Rolling;Sidelying to Sit;Sit to Sidelying Rolling: Min assist Sidelying to sit: Mod assist     Sit to sidelying: Mod assist General bed mobility comments: VCs for hand placement and technique. Assist required for LEs into bed and trunk elevation/controlled descent.  Transfers Overall transfer level: Needs assistance Equipment used:  Rolling walker (2 wheeled) Transfers: Sit to/from [facility] Sit to Stand: Min assist Stand pivot transfers: Min assist       General transfer comment: Assist to boost up from from EOB and BSC. Assist for balance in standing.    Balance Overall balance assessment: Needs assistance Sitting-balance support: Feet supported;No upper extremity supported Sitting balance-Leahy Scale: Poor     Standing balance support: Bilateral upper extremity supported;During functional activity Standing balance-Leahy Scale: Poor Standing balance comment: requires RW for support                            ADL Overall ADL's : Needs assistance/impaired Eating/Feeding: Set up;Sitting   Grooming: Set up;Sitting   Upper Body Bathing: Minimal assitance;Sitting   Lower Body Bathing: Maximal assistance;Sit to/from stand   Upper Body Dressing : Minimal assistance;Sitting   Lower Body Dressing: Maximal assistance;Sit to/from stand Lower Body Dressing Details (indicate cue type and reason): Pt unable to cross foot over opposite knee. Discussed use of AE for increased independence; pt agreeable-plan to practice next session. Toilet Transfer: Minimal Systems analyst Details (indicate cue type and reason): Pt only able to perform stand pivot transfer at this time secondary to pain and needing to urinate. Toileting- Clothing Manipulation and Hygiene: Total assistance;Sit to/from stand Toileting - Clothing Manipulation Details (indicate cue type and reason): for toilet hygiene.     Functional mobility during ADLs: Minimal assistance;Rolling walker (for stand pivot transfer) General ADL Comments: Pts daughter in law present for OT eval. Pt refusing to wear brace; states is is painful and she "just had surgery yesterday". Educated pt on need to wear brace when OOB; she verbalized  understanding. Pt impulsive with transfers attempting to grab onto therapist  during transfer instead of using RW to steady; max verbal cues given for safety.      Vision Vision Assessment?: No apparent visual deficits   Perception     Praxis      Pertinent Vitals/Pain Pain Assessment: 0-10 Pain Score: 8  Pain Location: back Pain Descriptors / Indicators: Aching;Grimacing;Guarding;Operative site guarding Pain Intervention(s): Limited activity within patient's tolerance;Monitored during session;Premedicated before session     Hand Dominance Right   Extremity/Trunk Assessment Upper Extremity Assessment Upper Extremity Assessment: Overall WFL for tasks assessed   Lower Extremity Assessment Lower Extremity Assessment: Defer to PT evaluation   Cervical / Trunk Assessment Cervical / Trunk Assessment: Normal   Communication Communication Communication: No difficulties   Cognition Arousal/Alertness: Awake/alert Behavior During Therapy: Impulsive Overall Cognitive Status: Within Functional Limits for tasks assessed                     General Comments       Exercises       Shoulder Instructions      Home Living Family/patient expects to be discharged to:: Private residence Living Arrangements: Alone Available Help at Discharge: Family;Available 24 hours/day (initially) Type of Home: House Home Access: Level entry     Home Layout: One level     Bathroom Shower/Tub: Occupational psychologist: Standard Bathroom Accessibility: Yes How Accessible: Accessible via walker Home Equipment: Cyrus - single point          Prior Functioning/Environment Level of Independence: Independent             OT Diagnosis: Generalized weakness;Acute pain   OT Problem List: Decreased strength;Decreased activity tolerance;Impaired balance (sitting and/or standing);Decreased safety awareness;Decreased knowledge of use of DME or AE;Decreased knowledge of precautions;Pain   OT Treatment/Interventions: Self-care/ADL training;Energy  conservation;DME and/or AE instruction;Therapeutic activities;Patient/family education;Balance training    OT Goals(Current goals can be found in the care plan section) Acute Rehab OT Goals Patient Stated Goal: Decrease pain OT Goal Formulation: With patient/family Time For Goal Achievement: 08/28/15 Potential to Achieve Goals: Good ADL Goals Pt Will Perform Grooming: with modified independence;standing Pt Will Perform Lower Body Bathing: with modified independence;with adaptive equipment;sit to/from stand Pt Will Perform Lower Body Dressing: with modified independence;with adaptive equipment;sit to/from stand Pt Will Transfer to Toilet: ambulating;bedside commode;with modified independence (over toilet) Pt Will Perform Toileting - Clothing Manipulation and hygiene: with modified independence;with adaptive equipment;sit to/from stand Additional ADL Goal #1: Pt will independently don/doff back brace for increased independence and safety with ADLs and functional mobiltiy. Additional ADL Goal #2: Pt will independently verbally recall 3/3 back precautions and maintain throughout ADL activity.  OT Frequency: Min 2X/week   Barriers to D/C: Decreased caregiver support  Lives alone, 24/7 supervision initially       Co-evaluation              End of Session Equipment Utilized During Treatment: Gait belt;Rolling walker (refused to wear back brace)  Activity Tolerance: Patient limited by pain Patient left: in bed;with call bell/phone within reach;with bed alarm set;with family/visitor present   Time: 1937-9024 OT Time Calculation (min): 17 min Charges:  OT General Charges $OT Visit: 1 Procedure OT Evaluation $OT Eval Moderate Complexity: 1 Procedure G-Codes:     Binnie Kand M.S., OTR/L Pager: [phone]  08/14/2015, 3:46 PM

## 2015-08-15 LAB — GLUCOSE, CAPILLARY
GLUCOSE-CAPILLARY: 272 mg/dL — AB (ref 65–99)
GLUCOSE-CAPILLARY: 267 mg/dL — AB (ref 65–99)
Glucose-Capillary: 245 mg/dL — ABNORMAL HIGH (ref 65–99)
Glucose-Capillary: 279 mg/dL — ABNORMAL HIGH (ref 65–99)
Glucose-Capillary: 252 mg/dL — ABNORMAL HIGH (ref 65–99)

## 2015-08-15 MED ORDER — MAGNESIUM CITRATE PO SOLN
1.0000 | Freq: Once | ORAL | Status: AC
  Administered 2015-08-15: 1 via ORAL
  Filled 2015-08-15: qty 296

## 2015-08-15 MED ORDER — LIRAGLUTIDE 18 MG/3ML ~~LOC~~ SOPN: 1.8000 mg | PEN_INJECTOR | Freq: Every day | SUBCUTANEOUS | Status: DC

## 2015-08-15 NOTE — Progress Notes (Signed)
Physical Therapy Treatment Patient Details Name: Sheri Reid MRN: [id number] DOB: November 24, 1940 Today's Date: 08/15/2015    History of Present Illness Pt is a 75 y.o. female who presents s/p L2-L5 anterior lateral lumbar fusion on 08/14/15.    PT Comments    Patient with improvement in bed mobility and transfers.  Fatigue limiting gait distance.  Follow Up Recommendations  Home health PT;Supervision for mobility/OOB     Equipment Recommendations  Rolling walker with 5" wheels;3in1 (PT)    Recommendations for Other Services       Precautions / Restrictions Precautions Precautions: Fall;Back Precaution Booklet Issued: Yes (comment) Precaution Comments: Reviewed precautions with patient and son Required Braces or Orthoses: Spinal Brace Spinal Brace: Lumbar corset;Applied in sitting position Restrictions Weight Bearing Restrictions: No    Mobility  Bed Mobility Overal bed mobility: Needs Assistance Bed Mobility: Sidelying to Sit   Sidelying to sit: Min guard     Sit to sidelying: Mod assist General bed mobility comments: Patient in sidelying position as PT entered room.  Cues to move to sitting using correct technique.  Patient required increased time, and was able to perform with min guard for safety only.  Patient required mod assist to don brace.  Transfers Overall transfer level: Needs assistance Equipment used: Rolling walker (2 wheeled) Transfers: Sit to/from Stand Sit to Stand: Min assist         General transfer comment: Verbal cues for hand placement.  Assist to rise from bed and BSC to steady.  Ambulation/Gait Ambulation/Gait assistance: Min assist Ambulation Distance (Feet): 60 Feet Assistive device: Rolling walker (2 wheeled) Gait Pattern/deviations: Step-through pattern;Decreased stride length;Trunk flexed Gait velocity: Decreased Gait velocity interpretation: Below normal speed for age/gender General Gait Details: Verbal cues for upright posture and  to increase step length.  Patient shuffling feet.  Required assist to maneuver RW especially around obstacles.   Stairs            Wheelchair Mobility    Modified Rankin (Stroke Patients Only)       Balance Overall balance assessment: Needs assistance Sitting-balance support: No upper extremity supported;Feet supported Sitting balance-Leahy Scale: Fair                              Cognition Arousal/Alertness: Awake/alert Behavior During Therapy: WFL for tasks assessed/performed;Anxious Overall Cognitive Status: Within Functional Limits for tasks assessed       Memory: Decreased recall of precautions              Exercises      General Comments        Pertinent Vitals/Pain Pain Assessment: 0-10 Pain Score: 4  Pain Location: Back Pain Descriptors / Indicators: Sore;Aching Pain Intervention(s): Limited activity within patient's tolerance;Monitored during session;Repositioned    Home Living                      Prior Function            PT Goals (current goals can now be found in the care plan section) Acute Rehab PT Goals Patient Stated Goal: brush her teeth Progress towards PT goals: Progressing toward goals    Frequency  Min 5X/week    PT Plan Current plan remains appropriate    Co-evaluation             End of Session Equipment Utilized During Treatment: Gait belt;Back brace Activity Tolerance: Patient limited by fatigue Patient left:  in chair;with call bell/phone within reach;with chair alarm set;with family/visitor present     Time: 6067-7034 PT Time Calculation (min) (ACUTE ONLY): 30 min  Charges:  $Gait Training: 23-37 mins                    G Codes:      Despina Pole 09-04-15, 11:50 AM Carita Pian. Sanjuana Kava, [place] Pager [phone]

## 2015-08-15 NOTE — Progress Notes (Signed)
No issues overnight. Pt has pain, but improving. Minimal leg pain. Ambulating with assistance.  EXAM:  BP 116/46 mmHg  Pulse 95  Temp(Src) 98.7 F (37.1 C) (Oral)  Resp 18  SpO2 94%  Awake, alert, oriented  Speech fluent, appropriate  CN grossly intact  5/5 BUE/BLE  Wound c/d/i  IMPRESSION:  75 y.o. female POD# 2 s/p L2-5 XLIF, progressing as expected  PLAN: - Cont to ambulate as tolerated - Mag citrate for constipation

## 2015-08-15 NOTE — Progress Notes (Signed)
Patient states last bowel movement was 08/11/15. States she has not been able to pass flatus. Stomach is distended, bowel sounds are active in all quadrants. Mag citrate administered, encouraged PO fluids, encouraged ambulation, patient states pain is too intense to ambulate. Refuses IV dilaudid, states possibility that PO narcotics are causing nausea. Zofran was administered, robaxin administered per patient request. Fleets enema administered, no result. Will continue to monitor patient for BM. Patient ambulated to chair with 2 assist.

## 2015-08-15 NOTE — Progress Notes (Signed)
Pt c/o abdominal discomfort and nausea, last BM 08/11/15, emesis 08/15/15 0430. Pt states she has "not been able to pass "good" flatus, more belching than anything." Pt ambulated in hall with assistance. Active bowel sounds in all 4 quadrants. Abdomen soft, distended. Zofran administered for nausea. See MAR for pain and BM pharmacological interventions. Pt refused pain medications due to no BM. Pt states I just want to poo. Pt very agitated and uncomfortable. RN provided ice pack to incision. RN paged on-call provider. Will continue to monitor Pt for BM and pain level.

## 2015-08-15 NOTE — Progress Notes (Signed)
Occupational Therapy Treatment Patient Details Name: Sheri Reid MRN: [id number] DOB: 03/05/1941 Today's Date: 08/15/2015    History of present illness Pt is a 75 y.o. female who presents s/p L2-L5 anterior lateral lumbar fusion on 08/14/15.   OT comments  Pt progressing. Education provided in session. Will continue to follow acutely.  Follow Up Recommendations  Home health OT;Supervision - Intermittent    Equipment Recommendations  3 in 1 bedside comode;Tub/shower seat;Other (comment) (AE)    Recommendations for Other Services      Precautions / Restrictions Precautions Precautions: Fall;Back Precaution Booklet Issued: No Precaution Comments: Reviewed precautions with pt Required Braces or Orthoses: Spinal Brace Spinal Brace: Lumbar corset (already on in session-doffed and adjusted in sitting) Restrictions Weight Bearing Restrictions: No       Mobility Bed Mobility Overal bed mobility: Needs Assistance Bed Mobility: Sit to Sidelying         Sit to sidelying: Mod assist General bed mobility comments: assist with LEs.   Transfers Overall transfer level: Needs assistance Equipment used: Rolling walker (2 wheeled) Transfers: Sit to/from Stand Sit to Stand: Min assist         General transfer comment: assist to stand from chair. Cue for hand placement.    Balance    Used RW for ambulation. Min guard-supervision while standing at sink and performing functional tasks.                               ADL Overall ADL's : Needs assistance/impaired     Grooming: Oral care;Min guard;Standing Grooming Details (indicate cue type and reason): Min guard-Supervision while standing at sink          Upper Body Dressing : Minimal assistance;Sitting Upper Body Dressing Details (indicate cue type and reason): managing back brace Lower Body Dressing: Set up;Supervision/safety;Sitting/lateral leans;With adaptive equipment Lower Body Dressing Details (indicate  cue type and reason): donned/doffed sock Toilet Transfer: Ambulation;RW;Minimal assistance (sit to stand from chair)           Functional mobility during ADLs: Min guard;Rolling walker General ADL Comments: Discussed incorporating precautions into functional activities. Educated on AE and pt practiced with reacher and sock aid. Discussed back brace. Discussed recommending only sitting for 45 mins-1 hour at a time.      Vision                     Perception     Praxis      Cognition  Awake/Alert Behavior During Therapy: WFL for tasks assessed/performed Overall Cognitive Status: Within Functional Limits for tasks assessed       Memory: Decreased recall of precautions               Extremity/Trunk Assessment               Exercises     Shoulder Instructions       General Comments      Pertinent Vitals/ Pain       Pain Assessment: 0-10 Pain Score:  (5-6) Pain Location: back Pain Descriptors / Indicators: Moaning Pain Intervention(s): Monitored during session;Repositioned;Other (comment) (notified nurse tech for ice)  Home Living                                          Prior Functioning/Environment  Frequency Min 2X/week     Progress Toward Goals  OT Goals(current goals can now be found in the care plan section)  Progress towards OT goals: Progressing toward goals  Acute Rehab OT Goals Patient Stated Goal: brush her teeth OT Goal Formulation: With patient/family Time For Goal Achievement: 08/28/15 Potential to Achieve Goals: Good ADL Goals Pt Will Perform Grooming: with modified independence;standing Pt Will Perform Lower Body Bathing: with modified independence;with adaptive equipment;sit to/from stand Pt Will Perform Lower Body Dressing: with modified independence;with adaptive equipment;sit to/from stand Pt Will Transfer to Toilet: ambulating;bedside commode;with modified independence (over  toilet) Pt Will Perform Toileting - Clothing Manipulation and hygiene: with modified independence;with adaptive equipment;sit to/from stand Additional ADL Goal #1: Pt will independently don/doff back brace for increased independence and safety with ADLs and functional mobiltiy. Additional ADL Goal #2: Pt will independently verbally recall 3/3 back precautions and maintain throughout ADL activity.  Plan Discharge plan needs to be updated    Co-evaluation                 End of Session Equipment Utilized During Treatment: Gait belt;Rolling walker;Other (comment);Back brace (AE)   Activity Tolerance Patient limited by pain   Patient Left in bed;with call bell/phone within reach;with family/visitor present   Nurse Communication Other (comment) (notified nurse tech for ice)        Time: 1834-3735 OT Time Calculation (min): 15 min  Charges: OT General Charges $OT Visit: 1 Procedure OT Treatments $Self Care/Home Management : 8-22 mins  Sheri Reid OTR/L [phone] 08/15/2015, 10:07 AM

## 2015-08-16 ENCOUNTER — Inpatient Hospital Stay (HOSPITAL_COMMUNITY): Payer: Medicare Other

## 2015-08-16 LAB — GLUCOSE, CAPILLARY
GLUCOSE-CAPILLARY: 206 mg/dL — AB (ref 65–99)
GLUCOSE-CAPILLARY: 250 mg/dL — AB (ref 65–99)
Glucose-Capillary: 195 mg/dL — ABNORMAL HIGH (ref 65–99)
Glucose-Capillary: 222 mg/dL — ABNORMAL HIGH (ref 65–99)

## 2015-08-16 NOTE — Progress Notes (Signed)
Physical Therapy Treatment Patient Details Name: Sheri Reid MRN: [id number] DOB: 02-08-1941 Today's Date: 08/16/2015    History of Present Illness Pt is a 74 y.o. female who presents s/p L2-L5 anterior lateral lumbar fusion on 08/14/15.    PT Comments    Patient limited today by feeling of nausea and c/o feeling "weak". Son present and supportive and provided encouragement. Pt maintained back precautions and demonstrated carry over of safe bed mobility and transfers. Continue to progress as tolerated.   Follow Up Recommendations  Home health PT;Supervision for mobility/OOB     Equipment Recommendations  Rolling walker with 5" wheels;3in1 (PT)    Recommendations for Other Services       Precautions / Restrictions Precautions Precautions: Fall;Back Precaution Booklet Issued: Yes (comment) Precaution Comments: Reviewed precautions with patient and son Required Braces or Orthoses: Spinal Brace Spinal Brace: Lumbar corset;Applied in sitting position Restrictions Weight Bearing Restrictions: No    Mobility  Bed Mobility Overal bed mobility: Needs Assistance Bed Mobility: Sidelying to Sit   Sidelying to sit: Min assist;Mod assist       General bed mobility comments: assist to roll to L side and elevate trunk into sitting; use of bedrail to push up into sitting; cues for hand placement and technique with pt demonstrating carry over from previous session  Transfers Overall transfer level: Needs assistance Equipment used: Rolling walker (2 wheeled) Transfers: Sit to/from Stand Sit to Stand: Min assist         General transfer comment: cues for hand placement and technique; assist to power up into standing  Ambulation/Gait Ambulation/Gait assistance: Min assist Ambulation Distance (Feet): 20 Feet Assistive device: Rolling walker (2 wheeled) Gait Pattern/deviations: Shuffle;Decreased stride length;Narrow base of support;Step-through pattern Gait velocity: Decreased    General Gait Details: pt with tendency to shuffle and needed max cues for increased step length and facilitation of bilat heel strike; very slow and guarded movements but pt reported that her back feels better in standing than lying in bed   Stairs            Wheelchair Mobility    Modified Rankin (Stroke Patients Only)       Balance Overall balance assessment: Needs assistance Sitting-balance support: No upper extremity supported;Feet supported Sitting balance-Leahy Scale: Fair     Standing balance support: Bilateral upper extremity supported Standing balance-Leahy Scale: Poor                      Cognition Arousal/Alertness: Awake/alert Behavior During Therapy: WFL for tasks assessed/performed;Anxious Overall Cognitive Status: Within Functional Limits for tasks assessed       Memory: Decreased recall of precautions              Exercises      General Comments General comments (skin integrity, edema, etc.): son present for session and very supportive and encouraging      Pertinent Vitals/Pain Pain Assessment: Faces Faces Pain Scale: Hurts little more Pain Location: back Pain Descriptors / Indicators: Sore Pain Intervention(s): Limited activity within patient's tolerance;Monitored during session;Premedicated before session;Repositioned    Home Living                      Prior Function            PT Goals (current goals can now be found in the care plan section) Acute Rehab PT Goals Patient Stated Goal: feel better Progress towards PT goals: Progressing toward goals    Frequency  Min 5X/week    PT Plan Current plan remains appropriate    Co-evaluation             End of Session Equipment Utilized During Treatment: Gait belt;Back brace Activity Tolerance: Patient limited by fatigue;Treatment limited secondary to medical complications (Comment) (nauseated) Patient left: in chair;with call bell/phone within reach;with  chair alarm set;with family/visitor present     Time: 7374-9664 PT Time Calculation (min) (ACUTE ONLY): 31 min  Charges:  $Gait Training: 8-22 mins $Therapeutic Activity: 8-22 mins                    G Codes:      Salina April, PTA Pager: [phone]   08/16/2015, 2:55 PM

## 2015-08-16 NOTE — Progress Notes (Signed)
Patient c/o nausea and full feeling. Pt ambulated to [facility], stated pain 9/10 at surgical site as well as abdominal bloating. Zofran and dilaudid administered, Pt refused robaxin due to "afraid its going to stop me from pooping". Robaxin returned to pyxis. Pt back to bed, repositioned on left side, pillow between legs and ice applied to lower back below surgical incision. Maalox given for indigestion. Will continue to monitor Pt pain and nausea.

## 2015-08-16 NOTE — Progress Notes (Signed)
Soap suds enema administered, no output. Notified MD, nasogastric tube inserted without issue, patient tolerated fairly well, stated it was very uncomfortable. Set to low intermittent suction per MD order. Patient made NPO, family educated. Patient educated about oral care while NPO, swabs at bedside, will continue to educate and monitor oral care. Patient is now resting in bed watching television. Will continue to monitor closely.

## 2015-08-16 NOTE — Progress Notes (Signed)
BP 128/43 mmHg  Pulse 90  Temp(Src) 98.3 F (36.8 C) (Oral)  Resp 18  Ht 5' 5"  (1.651 m)  Wt 149 lb 7.6 oz (67.8 kg)  BMI 24.87 kg/m2  SpO2 97% Alert and oriented x 4, speech is clear and fluent Moving lower extremities well Great deal of nausea and abdominal discomfort Will give soap suds enema, and if unsuccessful will have ng tube placed.

## 2015-08-17 LAB — GLUCOSE, CAPILLARY
GLUCOSE-CAPILLARY: 249 mg/dL — AB (ref 65–99)
GLUCOSE-CAPILLARY: 216 mg/dL — AB (ref 65–99)
GLUCOSE-CAPILLARY: 215 mg/dL — AB (ref 65–99)
GLUCOSE-CAPILLARY: 196 mg/dL — AB (ref 65–99)
Glucose-Capillary: 165 mg/dL — ABNORMAL HIGH (ref 65–99)
Glucose-Capillary: 199 mg/dL — ABNORMAL HIGH (ref 65–99)

## 2015-08-17 MED ORDER — BISACODYL 10 MG RE SUPP
10.0000 mg | Freq: Every day | RECTAL | Status: DC | PRN
  Administered 2015-08-17 – 2015-08-18 (×2): 10 mg via RECTAL
  Filled 2015-08-17 (×2): qty 1

## 2015-08-17 NOTE — Progress Notes (Signed)
Patient ID: Sheri Reid, female   DOB: 11/28/1940, 75 y.o.   MRN: [id number] Subjective:  The patient complains of abdominal discomfort. She has passed no flatus. She has not had a bowel movement. She wants the NG tube out. He has had no vomiting.  Objective: Vital signs in last 24 hours: Temp:  [97.7 F (36.5 C)-98.6 F (37 C)] 98.2 F (36.8 C) (03/06 0425) Pulse Rate:  [92-117] 101 (03/06 0425) Resp:  [18] 18 (03/06 0425) BP: (139-165)/(57-94) 161/72 mmHg (03/06 0425) SpO2:  [96 %-99 %] 96 % (03/06 0425)  Intake/Output from previous day: 03/05 0701 - 03/06 0700 In: 0  Out: 50 [Emesis/NG output:50] Intake/Output this shift:    Physical exam the patient is alert. He is moving her lower extremities well. Her abdomen is protuberant but soft.  Lab Results: No results for input(s): WBC, HGB, HCT, PLT in the last 72 hours. BMET No results for input(s): NA, K, CL, CO2, GLUCOSE, BUN, CREATININE, CALCIUM in the last 72 hours.  Studies/Results: Dg Abd Portable 1v  08/16/2015  CLINICAL DATA:  Acute onset of nausea, vomiting, abdominal distention and generalized abdominal pain. Initial encounter. EXAM: PORTABLE ABDOMEN - 1 VIEW COMPARISON:  Lumbar spine radiographs performed 08/13/2015 FINDINGS: The colon is partially filled with air. The visualized bowel gas pattern is grossly unremarkable. No free intra-abdominal air is seen, though evaluation for free air is limited on a single supine view. Bilateral hip arthroplasties are noted. This patient is status post lumbar spinal fusion at L2-L5. No acute osseous abnormalities are seen. IMPRESSION: Colon partially filled with air. Visualized bowel gas pattern grossly unremarkable. No free intra-abdominal air seen. Electronically Signed   By: Garald Balding M.Reid.   On: 08/16/2015 01:27    Assessment/Plan: Postop day #4: The patient is doing well neurologically.  Ileus, spastic colon, irritable bowel, constipation, etc.: I have discussed the  situation with the patient and her daughter at bedside. I have recommended that we minimize pain medications, that she continues to mobilize, etc. She understands that if we take that a NG tube out she may begin vomiting and we may need to put the NG tube back in. She is willing to take that risk. Her GI doctor is Dr. Collene Mares. We may need his input if her symptoms continue.  LOS: 4 days     Sheri Reid 08/17/2015, 7:43 AM

## 2015-08-17 NOTE — Progress Notes (Signed)
Occupational Therapy Treatment Patient Details Name: Sheri Reid MRN: [id number] DOB: 03-13-1941 Today's Date: 08/17/2015    History of present illness Pt is a 75 y.o. female who presents s/p L2-L5 anterior lateral lumbar fusion on 08/14/15.   OT comments  Continue to recommend [place]. Pt progressing and will continue to follow pt acutely.  Follow Up Recommendations  Home health OT;Supervision - Intermittent    Equipment Recommendations  3 in 1 bedside comode;Tub/shower seat;Other (comment) (AE)    Recommendations for Other Services      Precautions / Restrictions Precautions Precautions: Fall;Back Precaution Booklet Issued: Yes (comment) Precaution Comments: Pt able to state 2/3 back precautions; cues given for precautions in session Required Braces or Orthoses: Spinal Brace Spinal Brace: Lumbar corset;Applied in sitting position Restrictions Weight Bearing Restrictions: No       Mobility Bed Mobility Overal bed mobility: Needs Assistance Bed Mobility: Rolling;Sidelying to Sit Rolling: Supervision Sidelying to sit: Min assist       General bed mobility comments: assist with trunk. Cues given.  Transfers Overall transfer level: Needs assistance Equipment used: Rolling walker (2 wheeled) Transfers: Sit to/from Stand Sit to Stand: Min assist         General transfer comment: Min assist-Min guard    Balance LOB sitting EOB-assist given.        ADL Overall ADL's : Needs assistance/impaired Eating/Feeding: Supervision/ safety;Sitting Eating/Feeding Details (indicate cue type and reason): drinking liquid Grooming: Oral care;Standing;Min guard           Upper Body Dressing : Moderate assistance;Sitting Upper Body Dressing Details (indicate cue type and reason): back brace and assist for balance     Toilet Transfer: Ambulation;RW;Minimal assistance (3 in 1 over commode; Min assist-sit to stand from bed)   Toileting- Clothing Manipulation and Hygiene: Sit  to/from stand;Moderate assistance       Functional mobility during ADLs: Min guard;Rolling walker General ADL Comments: Cues for precautions in session. Discussed incorporating precautions into functional activities. Instructed to have clothing under brace and no sleeping in it. Explained moving can help with BM. Educated on what pt could use for toilet aid.      Vision                     Perception     Praxis      Cognition  Awake/Alert Behavior During Therapy: [facility] for tasks assessed/performed;Anxious Overall Cognitive Status: Within Functional Limits for tasks assessed       Memory: Decreased recall of precautions                Extremity/Trunk Assessment               Exercises     Shoulder Instructions       General Comments      Pertinent Vitals/ Pain       Pain Assessment: 0-10 Pain Score: 9  Pain Location: back Pain Intervention(s): Monitored during session;Repositioned  Home Living                                          Prior Functioning/Environment              Frequency Min 2X/week     Progress Toward Goals  OT Goals(current goals can now be found in the care plan section)  Progress towards OT goals: Progressing toward goals  Acute  Rehab OT Goals Patient Stated Goal: to poop OT Goal Formulation: With patient/family Time For Goal Achievement: 08/28/15 Potential to Achieve Goals: Good ADL Goals Pt Will Perform Grooming: with modified independence;standing Pt Will Perform Lower Body Bathing: with modified independence;with adaptive equipment;sit to/from stand Pt Will Perform Lower Body Dressing: with modified independence;with adaptive equipment;sit to/from stand Pt Will Transfer to Toilet: ambulating;bedside commode;with modified independence (over toilet) Pt Will Perform Toileting - Clothing Manipulation and hygiene: with modified independence;with adaptive equipment;sit to/from stand Additional ADL  Goal #1: Pt will independently don/doff back brace for increased independence and safety with ADLs and functional mobiltiy. Additional ADL Goal #2: Pt will independently verbally recall 3/3 back precautions and maintain throughout ADL activity.  Plan Discharge plan remains appropriate    Co-evaluation                 End of Session Equipment Utilized During Treatment: Gait belt;Rolling walker;Back brace   Activity Tolerance Patient limited by pain   Patient Left in chair;with call bell/phone within reach;with chair alarm set   Nurse Communication          Time: 1311-1330 OT Time Calculation (min): 19 min  Charges: OT General Charges $OT Visit: 1 Procedure OT Treatments $Self Care/Home Management : 8-22 mins  Benito Mccreedy OTR/L [phone] 08/17/2015, 2:08 PM

## 2015-08-17 NOTE — Care Management Important Message (Signed)
Important Message  Patient Details  Name: Sheri Reid MRN: [id number] Date of Birth: 01/07/1941   Medicare Important Message Given:  Yes    Sheri Reid Sheri Reid 08/17/2015, 3:02 PM

## 2015-08-17 NOTE — Care Management Note (Signed)
Case Management Note  Patient Details  Name: Sheri Reid MRN: [id number] Date of Birth: 1940/07/03  Subjective/Objective:      Patient underwent lumbar fusion. Patient is from home alone.               Action/Plan: PT/OT recommending HHPT/OT. CM continuing to follow for discharge needs.   Expected Discharge Date:                  Expected Discharge Plan:     In-House Referral:     Discharge planning Services     Post Acute Care Choice:    Choice offered to:     DME Arranged:    DME Agency:     HH Arranged:    HH Agency:     Status of Service:  In process, will continue to follow  Medicare Important Message Given:    Date Medicare IM Given:    Medicare IM give by:    Date Additional Medicare IM Given:    Additional Medicare Important Message give by:     If discussed at [place] of Stay Meetings, dates discussed:    Additional Comments:  Pollie Friar, RN 08/17/2015, 2:18 PM

## 2015-08-17 NOTE — Progress Notes (Signed)
Inpatient Diabetes Program Recommendations  AACE/ADA: New Consensus Statement on Inpatient Glycemic Control (2015)  Target Ranges:  Prepandial:   less than 140 mg/dL      Peak postprandial:   less than 180 mg/dL (1-2 hours)      Critically ill patients:  140 - 180 mg/dL   Results for CREOLA, KROTZ (MRN [id number]) as of 08/17/2015 09:21  Ref. Range 08/16/2015 06:29 08/16/2015 11:55 08/16/2015 16:36 08/16/2015 20:44 08/17/2015 01:47 08/17/2015 04:21 08/17/2015 08:16  Glucose-Capillary Latest Ref Range: 65-99 mg/dL 195 (H) 206 (H) 222 (H) 250 (H) 249 (H) 216 (H) 165 (H)   Review of Glycemic Control  Diabetes history: DM 2 Outpatient Diabetes medications: Victoza 1.8 mg Daily, Lantus 44 units, metformin 1,000 BID, Novolog 1-2 units prn Current orders for Inpatient glycemic control: Victoza 1.8 mg Daily, Lantus 44 units, metformin 1,000 BID, Novolog 1-2 units prn  Inpatient Diabetes Program Recommendations: Correction (SSI): Glucose increases into the 200's during the day. While inpatient, please start Novolog Sensitive Correction TID.  Thanks,   Tama Headings RN, MSN, [facility] Inpatient Diabetes Coordinator Team Pager [phone] (8a-5p)

## 2015-08-17 NOTE — Progress Notes (Signed)
Physical Therapy Treatment Patient Details Name: Sheri Reid MRN: [id number] DOB: [birth year] Today's Date: 08/17/2015    History of Present Illness Pt is a 75 y.o. female who presents s/p L2-L5 anterior lateral lumbar fusion on 08/14/15.    PT Comments    Pt progressing towards physical therapy goals. Pt with increased pain due to constipation. Was agreeable to gait training and chair follow was utilized due to pain and fatigue. Will continue to follow and progress as able per POC.   Follow Up Recommendations  Home health PT;Supervision for mobility/OOB     Equipment Recommendations  Rolling walker with 5" wheels;3in1 (PT)    Recommendations for Other Services       Precautions / Restrictions Precautions Precautions: Fall;Back Precaution Booklet Issued: Yes (comment) Precaution Comments: Reviewed precautions during functional mobility.  Required Braces or Orthoses: Spinal Brace Spinal Brace: Lumbar corset;Applied in sitting position Restrictions Weight Bearing Restrictions: No    Mobility  Bed Mobility               General bed mobility comments: Pt was received sitting on the commode with daughter present.   Transfers Overall transfer level: Needs assistance Equipment used: Rolling walker (2 wheeled) Transfers: Sit to/from Stand Sit to Stand: Min assist         General transfer comment: Steadying assist required for sit<>stand. VC's provided for hand placement on seated surface for safety.  Ambulation/Gait Ambulation/Gait assistance: Min assist Ambulation Distance (Feet): 60 Feet Assistive device: Rolling walker (2 wheeled) Gait Pattern/deviations: Step-through pattern;Decreased stride length;Trunk flexed Gait velocity: Decreased Gait velocity interpretation: Below normal speed for age/gender General Gait Details: Initially pt shuffling but was able to improve step/stride length with cueing. Assist required for walker management and balance support. Chair  follow utilized and pt took 1 seated rest break prior to return to room.    Stairs            Wheelchair Mobility    Modified Rankin (Stroke Patients Only)       Balance Overall balance assessment: Needs assistance Sitting-balance support: Feet supported;No upper extremity supported Sitting balance-Leahy Scale: Fair     Standing balance support: Bilateral upper extremity supported;During functional activity Standing balance-Leahy Scale: Poor Standing balance comment: Requires RW for support.                     Cognition Arousal/Alertness: Awake/alert Behavior During Therapy: WFL for tasks assessed/performed;Anxious Overall Cognitive Status: Within Functional Limits for tasks assessed       Memory: Decreased recall of precautions              Exercises      General Comments General comments (skin integrity, edema, etc.): Daughter present      Pertinent Vitals/Pain Pain Assessment: Faces Faces Pain Scale: Hurts even more Pain Location: back and abdomen (cramping due to constipation) Pain Descriptors / Indicators: Operative site guarding;Cramping Pain Intervention(s): Limited activity within patient's tolerance;Monitored during session;Repositioned    Home Living                      Prior Function            PT Goals (current goals can now be found in the care plan section) Acute Rehab PT Goals Patient Stated Goal: feel better PT Goal Formulation: With patient/family Time For Goal Achievement: 08/21/15 Potential to Achieve Goals: Good Progress towards PT goals: Progressing toward goals    Frequency  Min 5X/week  PT Plan Current plan remains appropriate    Co-evaluation             End of Session Equipment Utilized During Treatment: Gait belt;Back brace Activity Tolerance: Patient limited by pain;Patient limited by fatigue Patient left: in chair;with call bell/phone within reach;with chair alarm set;with  family/visitor present     Time: 6546-5035 PT Time Calculation (min) (ACUTE ONLY): 19 min  Charges:  $Gait Training: 8-22 mins                    G Codes:      Rolinda Roan 2015/09/09, 12:14 PM  Rolinda Roan, PT, DPT Acute Rehabilitation Services Pager: [phone]

## 2015-08-18 LAB — GLUCOSE, CAPILLARY
GLUCOSE-CAPILLARY: 110 mg/dL — AB (ref 65–99)
GLUCOSE-CAPILLARY: 122 mg/dL — AB (ref 65–99)
Glucose-Capillary: 85 mg/dL (ref 65–99)
Glucose-Capillary: 156 mg/dL — ABNORMAL HIGH (ref 65–99)

## 2015-08-18 MED ORDER — FLEET ENEMA 7-19 GM/118ML RE ENEM: 1.0000 | ENEMA | Freq: Once | RECTAL | Status: DC

## 2015-08-18 MED ORDER — MAGNESIUM CITRATE PO SOLN
1.0000 | Freq: Once | ORAL | Status: AC
  Administered 2015-08-18: 1 via ORAL
  Filled 2015-08-18: qty 296

## 2015-08-18 NOTE — Progress Notes (Signed)
Pt's daughter requesting Fleets enema stating that her mother still hasn't had a bowel movement and is uncomfortable. Md paged.

## 2015-08-18 NOTE — Progress Notes (Signed)
Patient ID: Sheri Reid, female   DOB: [birth year], 75 y.o.   MRN: [id number] Subjective:  The patient is alert and pleasant. She looks better today. She does not feel she is able to go home. She complains of constipation.)  Objective: Vital signs in last 24 hours: Temp:  [97.4 F (36.3 C)-98.6 F (37 C)] 97.8 F (36.6 C) (03/07 0541) Pulse Rate:  [84-114] 93 (03/07 0541) Resp:  [17-18] 18 (03/07 0541) BP: (138-169)/(7-78) 138/59 mmHg (03/07 0541) SpO2:  [92 %-98 %] 98 % (03/07 0541)  Intake/Output from previous day: 03/06 0701 - 03/07 0700 In: 3 [I.V.:3] Out: -  Intake/Output this shift:    Physical exam the patient is alert and pleasant. She is moving her lower extremities well. Her abdomen is protuberant but soft.  Lab Results: No results for input(s): WBC, HGB, HCT, PLT in the last 72 hours. BMET No results for input(s): NA, K, CL, CO2, GLUCOSE, BUN, CREATININE, CALCIUM in the last 72 hours.  Studies/Results: No results found.  Assessment/Plan: Postop day #5: The patient is doing better today. She has been given Muro lax for constipation. She may go home tomorrow.  LOS: 5 days     Sheri Reid D 08/18/2015, 7:55 AM

## 2015-08-18 NOTE — Progress Notes (Signed)
Patient has PRN order or Nova Log insulin 1-2 units will call pharmacy for clarification but patient is also receiving 44 units off Lantuss, so I am holding any PRN Nova Log for CBG of 156. Will continue to monitor.

## 2015-08-18 NOTE — Progress Notes (Signed)
Holding fleet enema because patient has no signs of impaction, bowel sounds are faint sugesting little or no parastalsis

## 2015-08-18 NOTE — Progress Notes (Addendum)
Patients family remains hypervigilant concerning patients bowel activity, at this time family is satisfied with current care mode and agrees that patient is not having an abnormal reaction to surgery and anesthesia she has defecated 3 times in the last 24 hours all of which have been small formed stool. Her 3 primary issue are (1) not drinking enough fluids (2) not eating (3) a reluctance to ambulate. Will continue to monitor.

## 2015-08-18 NOTE — Progress Notes (Signed)
Physical Therapy Treatment Patient Details Name: Sheri Reid MRN: [id number] DOB: Aug 24, 1940 Today's Date: 08/18/2015    History of Present Illness Pt is a 75 y.o. female who presents s/p L2-L5 anterior lateral lumbar fusion on 08/14/15.    PT Comments    Pt is progressing towards physical therapy goals. Pt's mobility was limited from increased pain, fatigue, and nausea likely due to constipation. Pt was agreeable with gait training and chair follow was utilized due to the pain and fatigue. Daughter was present during the session and encouraged the pt to continually improve gait and increase ambulation distance. Pt was left sitting in chair with daughter.     Follow Up Recommendations  Home health PT;Supervision for mobility/OOB     Equipment Recommendations  Rolling walker with 5" wheels    Recommendations for Other Services       Precautions / Restrictions Precautions Precautions: Fall;Back Precaution Comments: Reviewed precautions during functionally mobility Required Braces or Orthoses: Spinal Brace Spinal Brace: Lumbar corset;Applied in sitting position Restrictions Weight Bearing Restrictions: No    Mobility  Bed Mobility               General bed mobility comments: Pt was received sitting on the toilet with daughter present  Transfers Overall transfer level: Needs assistance Equipment used: Rolling walker (2 wheeled) Transfers: Sit to/from Stand Sit to Stand: Min assist         General transfer comment: Assist required to power up to full stand. Continues to need VCs for hand placement on sturdy surface for safety  Ambulation/Gait Ambulation/Gait assistance: Min assist Ambulation Distance (Feet): 30 Feet Assistive device: Rolling walker (2 wheeled) Gait Pattern/deviations: Shuffle;Narrow base of support;Step-to pattern Gait velocity: Decreased Gait velocity interpretation: Below normal speed for age/gender General Gait Details: Pt shuffles but takes  a few bigger strides if cued. Assist required for walker management and balance support. Chair follow utilized and pt took 1 seated rest break prior to return to room.    Stairs            Wheelchair Mobility    Modified Rankin (Stroke Patients Only)       Balance Overall balance assessment: Needs assistance Sitting-balance support: Feet supported;No upper extremity supported Sitting balance-Leahy Scale: Fair     Standing balance support: Bilateral upper extremity supported;During functional activity Standing balance-Leahy Scale: Poor                      Cognition Arousal/Alertness: Awake/alert Behavior During Therapy: WFL for tasks assessed/performed Overall Cognitive Status: Within Functional Limits for tasks assessed       Memory: Decreased recall of precautions              Exercises      General Comments General comments (skin integrity, edema, etc.): Daughter present during session      Pertinent Vitals/Pain Pain Assessment: Faces Faces Pain Scale: Hurts even more Pain Location: back and abdomen (due to constipation) Pain Intervention(s): Limited activity within patient's tolerance;Monitored during session;Repositioned    Home Living                      Prior Function            PT Goals (current goals can now be found in the care plan section) Acute Rehab PT Goals Patient Stated Goal: to poop PT Goal Formulation: With patient/family Time For Goal Achievement: 08/21/15 Potential to Achieve Goals: Good Progress towards PT goals:  Progressing toward goals    Frequency  Min 5X/week    PT Plan Current plan remains appropriate    Co-evaluation             End of Session Equipment Utilized During Treatment: Gait belt;Back brace Activity Tolerance: Patient limited by pain;Treatment limited secondary to medical complications (Comment) (nauseated and constipated ) Patient left: in chair;with family/visitor present;with  chair alarm set     Time: [phone] PT Time Calculation (min) (ACUTE ONLY): 22 min  Charges:                       G CodesPhillip Heal, SPTA   08/18/2015, 1:40 PM

## 2015-08-18 NOTE — Progress Notes (Signed)
Pt noted at this time resting quietly.  Administered Zofran per request. Effective. Pt states that she is still feeling uncomfortable insisting that she needs to have a bowel movement to feel better. Daughter at bedside requested suppository and Mag citrate Md notified, received verbal order administered with no bowel activity. Pt still eats little and refused to drink all of the mag citrate stating, " Its too nasty for me to finish."  She does have gas, ambulated to this am with rolling walker in to hall with therapy this am. Encouraged to ambulate this afternoon but she stated she was too sick to move. Surgical dressing site dry and intact. Pain medication has been administered for comfort. No noted distress. Will continue to monitor.

## 2015-08-19 ENCOUNTER — Inpatient Hospital Stay (HOSPITAL_COMMUNITY): Payer: Medicare Other

## 2015-08-19 DIAGNOSIS — R140 Abdominal distension (gaseous): Secondary | ICD-10-CM

## 2015-08-19 DIAGNOSIS — Z9889 Other specified postprocedural states: Secondary | ICD-10-CM

## 2015-08-19 DIAGNOSIS — E118 Type 2 diabetes mellitus with unspecified complications: Secondary | ICD-10-CM

## 2015-08-19 DIAGNOSIS — K51919 Ulcerative colitis, unspecified with unspecified complications: Secondary | ICD-10-CM

## 2015-08-19 DIAGNOSIS — E162 Hypoglycemia, unspecified: Secondary | ICD-10-CM

## 2015-08-19 DIAGNOSIS — R112 Nausea with vomiting, unspecified: Secondary | ICD-10-CM

## 2015-08-19 DIAGNOSIS — K5190 Ulcerative colitis, unspecified, without complications: Secondary | ICD-10-CM

## 2015-08-19 DIAGNOSIS — R1084 Generalized abdominal pain: Secondary | ICD-10-CM

## 2015-08-19 DIAGNOSIS — R197 Diarrhea, unspecified: Secondary | ICD-10-CM

## 2015-08-19 DIAGNOSIS — R109 Unspecified abdominal pain: Secondary | ICD-10-CM

## 2015-08-19 LAB — CBC WITH DIFFERENTIAL/PLATELET
BASOS ABS: 0 10*3/uL (ref 0.0–0.1)
Basophils Relative: 0 %
Eosinophils Absolute: 0 10*3/uL (ref 0.0–0.7)
Eosinophils Relative: 0 %
HEMATOCRIT: 34.8 % — AB (ref 36.0–46.0)
HEMOGLOBIN: 11.9 g/dL — AB (ref 12.0–15.0)
Lymphocytes Relative: 17 %
Lymphs Abs: 1.6 10*3/uL (ref 0.7–4.0)
MCH: 31.7 pg (ref 26.0–34.0)
MCHC: 34.2 g/dL (ref 30.0–36.0)
MCV: 92.8 fL (ref 78.0–100.0)
Monocytes Absolute: 1 10*3/uL (ref 0.1–1.0)
Monocytes Relative: 10 %
NEUTROS ABS: 7.2 10*3/uL (ref 1.7–7.7)
NEUTROS PCT: 73 %
Platelets: 283 10*3/uL (ref 150–400)
RBC: 3.75 MIL/uL — ABNORMAL LOW (ref 3.87–5.11)
RDW: 12.8 % (ref 11.5–15.5)
WBC: 9.8 10*3/uL (ref 4.0–10.5)

## 2015-08-19 LAB — COMPREHENSIVE METABOLIC PANEL
ALBUMIN: 2.7 g/dL — AB (ref 3.5–5.0)
ALT: 27 U/L (ref 14–54)
AST: 27 U/L (ref 15–41)
Alkaline Phosphatase: 46 U/L (ref 38–126)
Anion gap: 7 (ref 5–15)
BILIRUBIN TOTAL: 0.6 mg/dL (ref 0.3–1.2)
BUN: 9 mg/dL (ref 6–20)
CO2: 33 mmol/L — AB (ref 22–32)
Calcium: 8.8 mg/dL — ABNORMAL LOW (ref 8.9–10.3)
Chloride: 98 mmol/L — ABNORMAL LOW (ref 101–111)
Creatinine, Ser: 0.92 mg/dL (ref 0.44–1.00)
GFR calc Af Amer: >60 mL/min (ref >60)
GFR calc non Af Amer: 59 mL/min — ABNORMAL LOW (ref >60)
GLUCOSE: 185 mg/dL — AB (ref 65–99)
POTASSIUM: 4.1 mmol/L (ref 3.5–5.1)
Sodium: 138 mmol/L (ref 135–145)
TOTAL PROTEIN: 6 g/dL — AB (ref 6.5–8.1)

## 2015-08-19 LAB — GLUCOSE, CAPILLARY
GLUCOSE-CAPILLARY: 146 mg/dL — AB (ref 65–99)
Glucose-Capillary: 59 mg/dL — ABNORMAL LOW (ref 65–99)
Glucose-Capillary: 63 mg/dL — ABNORMAL LOW (ref 65–99)
Glucose-Capillary: 94 mg/dL (ref 65–99)
Glucose-Capillary: 148 mg/dL — ABNORMAL HIGH (ref 65–99)
Glucose-Capillary: 222 mg/dL — ABNORMAL HIGH (ref 65–99)

## 2015-08-19 MED ORDER — SUCRALFATE 1 GM/10ML PO SUSP
1.0000 g | Freq: Three times a day (TID) | ORAL | Status: DC
  Administered 2015-08-19 – 2015-08-25 (×23): 1 g via ORAL
  Filled 2015-08-19 (×23): qty 10

## 2015-08-19 MED ORDER — INSULIN GLARGINE 100 UNIT/ML ~~LOC~~ SOLN
22.0000 [IU] | Freq: Every day | SUBCUTANEOUS | Status: DC
  Administered 2015-08-19 – 2015-08-20 (×2): 22 [IU] via SUBCUTANEOUS
  Filled 2015-08-19 (×4): qty 0.22

## 2015-08-19 MED ORDER — ONDANSETRON HCL 4 MG/2ML IJ SOLN
4.0000 mg | Freq: Four times a day (QID) | INTRAMUSCULAR | Status: DC | PRN
Start: 2015-08-19 — End: 2015-08-26
  Administered 2015-08-19 – 2015-08-20 (×4): 4 mg via INTRAVENOUS
  Filled 2015-08-19 (×5): qty 2

## 2015-08-19 MED ORDER — INSULIN ASPART 100 UNIT/ML ~~LOC~~ SOLN
0.0000 [IU] | Freq: Every day | SUBCUTANEOUS | Status: DC
  Administered 2015-08-19 – 2015-08-21 (×3): 2 [IU] via SUBCUTANEOUS
  Administered 2015-08-24: 4 [IU] via SUBCUTANEOUS

## 2015-08-19 MED ORDER — INSULIN ASPART 100 UNIT/ML ~~LOC~~ SOLN
0.0000 [IU] | Freq: Three times a day (TID) | SUBCUTANEOUS | Status: DC
  Administered 2015-08-19 (×2): 1 [IU] via SUBCUTANEOUS
  Administered 2015-08-20 – 2015-08-22 (×4): 2 [IU] via SUBCUTANEOUS
  Administered 2015-08-22 (×2): 3 [IU] via SUBCUTANEOUS
  Administered 2015-08-23: 5 [IU] via SUBCUTANEOUS
  Administered 2015-08-23 – 2015-08-24 (×3): 2 [IU] via SUBCUTANEOUS
  Administered 2015-08-24: 5 [IU] via SUBCUTANEOUS
  Administered 2015-08-24 – 2015-08-25 (×3): 2 [IU] via SUBCUTANEOUS
  Administered 2015-08-25: 3 [IU] via SUBCUTANEOUS
  Administered 2015-08-26 (×2): 2 [IU] via SUBCUTANEOUS

## 2015-08-19 NOTE — Consult Note (Signed)
[facility] Medical Consultation  AZARA GEMME RKY:[id number] DOB: 1940-09-25 DOA: 08/13/2015 PCP: Talmage Coin, MD   Requesting physician:  Dr Cyndy Freeze - Neurosurgery Date of consultation: 08/19/15 Reason for consultation: Hypoglycemia and n/v/d  Impression/Recommendations Active Problems:   Lumbar scoliosis   S/P spinal surgery   Nausea with vomiting   Diarrhea   Abdominal pain   Hypoglycemia   Diabetes mellitus with complication ([place])   Ulcerative colitis ([place])   Hypoglycemia: Patient with underlying diabetes and has been maintained on full dose Lantus and sliding scale insulin since admission. Of note over the last couple days patient's oral intake has decreased significantly. Patient with hypoglycemic event last night down to 63 glucose. Patient responded well to oral glucose agents. - Decrease Lantus to half dose - Increased sliding scale insulin - DC metformin, and victoza  Nausea, vomiting, diarrhea, abdominal pain: Patient with complex abdominal history including ulcerative colitis. Patient without any intra-abdominal surgeries. Pt is s/p L2-5 XLIF POD#6. Pt received perioperative Ancef. H/o GERD. Differential includes viral gastro, Cdiff, UC flare, early SBO, Opioid induced intermittent ileus, Hpylori infection. Discussed case Dr Collene Mares, pts GI doctor, who agreed w/ care plan. May formally consult if needed.  - CBC w/ diff, CMET, lactic acid - KUB - lactoferrin, Cdiff, Noro PCR, Hpylori - Carafate - Hold PPI until Cdiff returns then change to BID if Cdiff neg - consider NG tube replacement if SBO present.  - Minimize opioid use - Increase IVF to 139m/hr - zofran prn - Clear liquid diet  Spinal surgery: s/p L2-5 XLIF POD#6. Care per primary team. Pt admitted for elective procedure on 08/13/15.   I will followup again tomorrow. Please contact me if I can be of assistance in the meanwhile. Thank you for this consultation.  Chief Complaint: n/v/d/ abd pain  HPI:   On 08/13/2015 for elective spinal surgery. Patient is currently postop day 6 after L2-L5 XLIF. Consulted on 08/19/2015 by Dr. dCyndy Freeze primary team for assistance with intermittent hypoglycemia and ongoing nausea vomiting diarrhea and abdominal pain. Of note patient has been continued on her home Lantus. With decreased oral intake patient's glucoses trended down. Patient denies any syncope, headache, tremor, diaphoresis from these episodes. Patient corrected after orange juice administration. Patient was without bowel movement from 08/13/2015 until 08/17/15. On 08/16/2015 patient developed abdominal pain, nausea and vomiting. Patient had an NG tube in for couple of days for decompression this is since been DC'd. Patient developed soft loose stools starting on 08/17/2015. Laxatives were given for presumed constipation which is worsened some of the patient's symptoms. Denies any fevers, chest pain, shortness breath, palpitations, rash, neck stiffness, headache or out.   Review of Systems:  Per history of present illness with all other systems negative  Past Medical History  Diagnosis Date  . Diabetic peripheral neuropathy ([place]   . Depression     with pain  . GERD (gastroesophageal reflux disease)   . Constipation due to pain medication   . Tremor of unknown origin     family has it  . Ulcerative colitis ([place]   . Type II diabetes mellitus ([place]   . Arthritis     "back" (08/13/2015)  . Chronic lower back pain    Past Surgical History  Procedure Laterality Date  . Joint replacement    . 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 Cataract extraction w/ intraocular lens  implant, bilateral Bilateral   . Colonoscopy    . Anterior lateral lumbar fusion with percutaneous screw  3 level Left 08/13/2015    L2-3 L3-4 L4-5 Anterolateral interbody fusion with percutaneous pedicle screws   . 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 Back surgery    . Anterior lat lumbar fusion Left 08/13/2015     Procedure: Left Access L2-3 L3-4 L4-5 Anterolateral interbody fusion ;  Surgeon: Erline Levine, MD;  Location: [facility];  Service: Neurosurgery;  Laterality: Left;  Left Access L2-3 L3-4 L4-5 Anterolateral interbody fusion   . Lumbar percutaneous pedicle screw 3 level N/A 08/13/2015    Procedure: L2-3 L3-4 L4-5 Anterolateral interbody fusion with percutaneous pedicle screws;  Surgeon: Erline Levine, MD;  Location: [facility];  Service: Neurosurgery;  Laterality: N/A;  L2-3 L3-4 L4-5 Anterolateral interbody fusion with percutaneous pedicle screws   Social History:  reports that she has quit smoking. Her smoking use included Cigarettes. She has a 45 pack-year smoking history. She has never used smokeless tobacco. She reports that she drinks about 1.2 oz of alcohol per week. She reports that she does not use illicit drugs.  No Known Allergies History reviewed. No pertinent family history.  Prior to Admission medications   Medication Sig Start Date End Date Taking? Authorizing Provider  acetaminophen (TYLENOL) 500 MG tablet Take 500-1,000 mg by mouth 2 (two) times daily as needed for moderate pain.   Yes Historical Provider, MD  APRISO 0.375 g 24 hr capsule Take 2 tablets by mouth daily. 05/11/15  Yes Historical Provider, MD  Ascorbic Acid (VITAMIN C) 1000 MG tablet Take 1,000 mg by mouth daily.   Yes Historical Provider, MD  Biotin (BIOTIN 5000) 5 MG CAPS Take 5,000 mg by mouth daily.   Yes Historical Provider, MD  Coenzyme Q10 (CO Q-10) 200 MG CAPS Take 200 mg by mouth daily.   Yes Historical Provider, MD  Cranberry 500 MG CAPS Take 500 mg by mouth daily.   Yes Historical Provider, MD  ergocalciferol (VITAMIN D2) 50000 units capsule Take 50,000 Units by mouth every 14 (fourteen) days.   Yes Historical Provider, MD  HYDROcodone-acetaminophen (NORCO/VICODIN) 5-325 MG tablet Take 1 tablet by mouth 3 (three) times daily as needed for moderate pain.  07/21/15  Yes Historical Provider, MD  LANTUS SOLOSTAR  100 UNIT/ML Solostar Pen Inject 44 Units into the skin at bedtime. 06/10/15  Yes Historical Provider, MD  levocetirizine (XYZAL) 5 MG tablet Take 5 mg by mouth daily. 06/16/15  Yes Historical Provider, MD  lisinopril (PRINIVIL,ZESTRIL) 5 MG tablet Take 10 mg by mouth daily. 04/27/15  Yes Historical Provider, MD  metFORMIN (GLUCOPHAGE) 1000 MG tablet Take 1,000 mg by mouth 2 (two) times daily with a meal.  07/07/15  Yes Historical Provider, MD  Multiple Vitamin (MULTIVITAMIN) tablet Take 1 tablet by mouth daily.   Yes Historical Provider, MD  NOVOLOG FLEXPEN 100 UNIT/ML FlexPen Inject 1-2 Units into the skin daily as needed for high blood sugar.  05/28/15  Yes Historical Provider, MD  Probiotic Product (PROBIOTIC DAILY PO) Take 3,000 mcg by mouth daily.   Yes Historical Provider, MD  traMADol (ULTRAM) 50 MG tablet Take 50 mg by mouth every 6 (six) hours as needed for moderate pain.  07/20/15  Yes Historical Provider, MD  VICTOZA 18 MG/3ML SOPN Inject 1.8 mg into the skin every morning.  07/16/15  Yes Historical Provider, MD  vitamin B-12 (CYANOCOBALAMIN) 1000 MCG tablet Take 3,000 mcg by mouth daily.   Yes Historical Provider, MD  VOLTAREN 1 % GEL Apply 2 g topically daily as needed (for pain).  07/14/15  Yes Historical Provider, MD  tiZANidine (ZANAFLEX) 2 MG tablet Take 1 mg by mouth at bedtime. 07/21/15   Historical Provider, MD   Physical Exam: Blood pressure 163/70, pulse 88, temperature 98.7 F (37.1 C), temperature source Oral, resp. rate 18, height 5' 5"  (1.651 m), weight 67.8 kg (149 lb 7.6 oz), SpO2 97 %. Filed Vitals:   08/19/15 0535 08/19/15 0954  BP: 143/63 163/70  Pulse: 82 88  Temp: 97.7 F (36.5 C) 98.7 F (37.1 C)  Resp: 18 18    Physical Exam  Constitutional: oriented to person, place, and time. appears well-developed and well-nourished. No distress.  HENT:  Head: Normocephalic and atraumatic.  Eyes: EOMI. PERRL.  Neck: Normal range of motion.  Cardiovascular: RRR, no m/r/g, 2+  distal pulses,  Pulmonary/Chest: Effort normal and breath sounds normal. No respiratory distress.  Abdominal: Soft. Hypoactive bowel sounds, mild distention, nontender to palpation  Musculoskeletal: Upper and lower extremity with Normal range of motion. Non ttp, Neurological: alert and oriented to person, place, and time.  Skin: Skin is warm. No rash noted. non diaphoretic.  Psychiatric: normal mood and affect. behavior is normal. Judgment and thought content normal.    Labs on Admission:  Basic Metabolic Panel: No results for input(s): NA, K, CL, CO2, GLUCOSE, BUN, CREATININE, CALCIUM, MG, PHOS in the last 168 hours. Liver Function Tests: No results for input(s): AST, ALT, ALKPHOS, BILITOT, PROT, ALBUMIN in the last 168 hours. No results for input(s): LIPASE, AMYLASE in the last 168 hours. No results for input(s): AMMONIA in the last 168 hours. CBC: No results for input(s): WBC, NEUTROABS, HGB, HCT, MCV, PLT in the last 168 hours. Cardiac Enzymes: No results for input(s): CKTOTAL, CKMB, CKMBINDEX, TROPONINI in the last 168 hours. BNP: Invalid input(s): POCBNP CBG:  Recent Labs Lab 08/18/15 2158 08/19/15 0703 08/19/15 0718 08/19/15 0737 08/19/15 1136  GLUCAP 156* 59* 63* 94 146*    Radiological Exams on Admission: No results found.    MERRELL, DAVID J [facility]  If 7PM-7AM, please contact night-coverage [email or web address] Password [email or web address] 08/19/2015, 12:02 PM

## 2015-08-19 NOTE — Progress Notes (Signed)
Patient CBG was 59 at 0700 gave her 8 OZ's of orange juice and rechecked CBG 64 after another 8 oz of OJ the blood sugar came up to 94. Will continue to monitor.

## 2015-08-19 NOTE — Progress Notes (Signed)
Emesis overnight.  Hypoglycemia.  Cyclical diarrhea and vomiting. AVSS Moves extremities well GI issues only thing keeping patient in hospital Will consult hospitalist

## 2015-08-19 NOTE — Progress Notes (Signed)
Occupational Therapy Treatment Patient Details Name: Sheri Reid MRN: [id number] DOB: 1940-06-26 Today's Date: 08/19/2015    History of present illness Pt is a 75 y.o. female who presents s/p L2-L5 anterior lateral lumbar fusion on 08/14/15.   OT comments  Pts progress toward OT goals limited by stomach pain. Pt only able to perform stand pivot transfer to [facility] with min guard assist for safety. Pt able to verbally recall 3/3 back precautions but required VCs throughout for technique to maintain precautions with functional activities. D/c plan remains appropriate at this time. Will continue to follow acutely.    Follow Up Recommendations  Home health OT;Supervision - Intermittent    Equipment Recommendations  3 in 1 bedside comode;Tub/shower seat;Other (comment) (AE)    Recommendations for Other Services      Precautions / Restrictions Precautions Precautions: Fall;Back Precaution Comments: Pt able to verbally recall 3/3 back precautions Required Braces or Orthoses: Spinal Brace Spinal Brace: Lumbar corset;Applied in sitting position Restrictions Weight Bearing Restrictions: No       Mobility Bed Mobility Overal bed mobility: Needs Assistance Bed Mobility: Rolling;Sidelying to Sit;Sit to Sidelying Rolling: Supervision Sidelying to sit: Min assist (for trunk to upright position)     Sit to sidelying: Min assist (for LEs into bed) General bed mobility comments: Good log roll technique with HOB flat and use of bed rails.  Transfers Overall transfer level: Needs assistance Equipment used: Rolling walker (2 wheeled) Transfers: Sit to/from [facility] Sit to Stand: Min guard Stand pivot transfers: Min guard       General transfer comment: Min guard for safety. VCs for hand placement and technique.    Balance Overall balance assessment: Needs assistance Sitting-balance support: Feet supported;No upper extremity supported Sitting balance-Leahy Scale:  Fair     Standing balance support: Bilateral upper extremity supported;During functional activity Standing balance-Leahy Scale: Poor Standing balance comment: RW for support                   ADL Overall ADL's : Needs assistance/impaired                         Toilet Transfer: Min Geophysical data processor Details (indicate cue type and reason): Pt requesting to perform stand pivot transfer instead of ambulation to bathroom secondary to stomach pain. Toileting- Clothing Manipulation and Hygiene: Total assistance;Sit to/from stand Toileting - Clothing Manipulation Details (indicate cue type and reason): for toilet hygiene     Functional mobility during ADLs: Min guard;Rolling walker (for stand pivot) General ADL Comments: Pts daughter present for OT session. Pt declining further ADLs or functional mobility at this time secondary to stomach pain. Reviewed all precautions with pt in relation to functional activities; pt verbalized understanding.      Vision                     Perception     Praxis      Cognition   Behavior During Therapy: [facility] for tasks assessed/performed Overall Cognitive Status: Within Functional Limits for tasks assessed                       Extremity/Trunk Assessment               Exercises     Shoulder Instructions       General Comments      Pertinent Vitals/ Pain       Pain Assessment:  0-10 Pain Score: 8  Pain Location: back-4, stomach-8 Pain Descriptors / Indicators: Cramping;Aching Pain Intervention(s): Limited activity within patient's tolerance;Monitored during session  Home Living                                          Prior Functioning/Environment              Frequency Min 2X/week     Progress Toward Goals  OT Goals(current goals can now be found in the care plan section)  Progress towards OT goals: Progressing toward goals  Acute Rehab OT  Goals Patient Stated Goal: to poop OT Goal Formulation: With patient/family  Plan Discharge plan remains appropriate    Co-evaluation                 End of Session Equipment Utilized During Treatment: Gait belt;Rolling walker (declined use of back brace for stand pivot transfer)   Activity Tolerance Patient limited by pain   Patient Left in bed;with call bell/phone within reach;with family/visitor present   Nurse Communication          Time: 3361-2244 OT Time Calculation (min): 24 min  Charges: OT General Charges $OT Visit: 1 Procedure OT Treatments $Self Care/Home Management : 23-37 mins   Binnie Kand M.S., OTR/L Pager: [phone]  08/19/2015, 3:43 PM

## 2015-08-19 NOTE — Progress Notes (Addendum)
Patient's family very concerned about pt's distended abdomen and discomfort associated. She continues to have pain along with nausea and vomiting. On call NP called the family to speak with them about results and possible treatment. Patient's son would like to speak with MD in the am. Will continue to monitor and control pain this evening.    Son's name is Alexiss Iturralde and his cell number is [phone]. Khole Arterburn, Rande Brunt, RN

## 2015-08-20 ENCOUNTER — Inpatient Hospital Stay (HOSPITAL_COMMUNITY): Payer: Medicare Other

## 2015-08-20 DIAGNOSIS — K5669 Other intestinal obstruction: Secondary | ICD-10-CM

## 2015-08-20 LAB — H.PYLORI ANTIGEN, STOOL

## 2015-08-20 LAB — GLUCOSE, CAPILLARY
GLUCOSE-CAPILLARY: 166 mg/dL — AB (ref 65–99)
Glucose-Capillary: 163 mg/dL — ABNORMAL HIGH (ref 65–99)
Glucose-Capillary: 184 mg/dL — ABNORMAL HIGH (ref 65–99)
Glucose-Capillary: 242 mg/dL — ABNORMAL HIGH (ref 65–99)

## 2015-08-20 LAB — FECAL LACTOFERRIN, QUANT: Fecal Lactoferrin: POSITIVE

## 2015-08-20 LAB — C DIFFICILE QUICK SCREEN W PCR REFLEX
C DIFFICILE (CDIFF) INTERP: NEGATIVE
C DIFFICILE (CDIFF) TOXIN: NEGATIVE
C DIFFICLE (CDIFF) ANTIGEN: NEGATIVE

## 2015-08-20 LAB — LACTIC ACID, PLASMA: LACTIC ACID, VENOUS: 2 mmol/L (ref 0.5–2.0)

## 2015-08-20 MED ORDER — PANTOPRAZOLE SODIUM 40 MG IV SOLR
40.0000 mg | Freq: Two times a day (BID) | INTRAVENOUS | Status: DC
  Administered 2015-08-20 – 2015-08-25 (×9): 40 mg via INTRAVENOUS
  Filled 2015-08-20 (×10): qty 40

## 2015-08-20 MED ORDER — IOHEXOL 300 MG/ML  SOLN
25.0000 mL | INTRAMUSCULAR | Status: AC
  Administered 2015-08-20 (×2): 25 mL via ORAL

## 2015-08-20 MED ORDER — DICYCLOMINE HCL 20 MG PO TABS
20.0000 mg | ORAL_TABLET | Freq: Three times a day (TID) | ORAL | Status: DC
  Administered 2015-08-20 – 2015-08-26 (×16): 20 mg via ORAL
  Filled 2015-08-20 (×16): qty 1

## 2015-08-20 NOTE — Progress Notes (Signed)
Patient in 10/10 pain this evening in her abdomen. Abdomen very distended. Pt did have a BM this evening and continues to pass gas. Was not able to drink very much of the contrast for the CT. New IV placed. Monitoring closely. Son thinks patient may need a psych consult at this point. He will return tomorrow afternoon. Cleone Hulick, Rande Brunt, RN

## 2015-08-20 NOTE — Progress Notes (Signed)
PT Cancellation Note  Patient Details Name: Sheri Reid MRN: [id number] DOB: 19-Feb-1941   Cancelled Treatment:    Reason Eval/Treat Not Completed: Pain limiting ability to participate Declines working with physical therapy at this time. States "my stomach is going to explode." RN notified.  Ellouise Newer 08/20/2015, 11:43 AM Elayne Snare, [place]

## 2015-08-20 NOTE — Progress Notes (Signed)
Still with abdominal pain AVSS Awake and alert Full strength in legs Labs reassuring Stable Appreciate Hospitalist assistance

## 2015-08-20 NOTE — Progress Notes (Signed)
[facility] PROGRESS NOTE  Sheri Reid GNF:[id number] DOB: 09-May-1941 DOA: 08/13/2015 PCP: Talmage Coin, MD  Brief narrative 75 year old female with lumbar spondylolisthesis, insulin-dependent diabetes mellitus who was admitted for elective decompressive lumbar laminectomy (left axis L2-L5 anterior lateral interbody fusion with percutaneous pedicle screws on 08/13/2015). On 08/19/2015 hospitalist was consulted for episodes of intermittent hypoglycemia with ongoing nausea, vomiting,  and abdominal pain. Patient did not have any bowel movement since 08/13/2015. She had an NG tube placed for about 2 days for decompression that was removed. She had loose bowel movement on 3/6 following an excision of laxatives. Exam she had significant abdominal distention with abdominal x-ray showing partial postoperative ileus.    Assessment/Plan: Hypoglycemia Suspected due to poor by mouth intake with ongoing nausea and vomiting and a shift getting full dose of Lantus. Lantus dose reduce by half to 22 units daily along with sliding scale coverage and fingersticks much stable. Metformin and Victoza have been discontinued.  Abdominal pain and distention associated with nausea and vomiting with partial small bowel obstruction Patient likely has post operative ileus mainly involving transverse and ascending colon. X-ray done showed mild gaseous distention of the small bowel loops with significant distention of the right colon (measuring up to 13 cm) and moderate gaseous distention of transverse colon. Placed on PPI, Carafate and simethicone. Stool for C. difficile negative. -Serial abdominal exam. Supportive care with when necessary Zofran. Continue IV hydration. Keep K >4. She has history of ulcerative colitis and findings unlikely for flareup. Lactic normal. Will obtain CT of her abdomen and pelvis with contrast to evaluate further. Patient follows with Dr. Collene Mares. Will consult her if symptoms persistent. -Switch to  clear liquid. Discontinue Vicodin and placed on when necessary low-dose Dilaudid only.   Lumbar spinal surgery Per primary team.     Code Status: Full code Family Communication: Discussed with son [name] at bedside Disposition Plan: Inpatient monitoring    Procedures:  Abdominal CT  Antibiotics:  None  HPI/Subjective: See and examined. Complains of abdominal pain and distention. Reports nausea but no vomiting. Had a small bowel movement this morning and is passing gas.  Objective: Filed Vitals:   08/20/15 1006 08/20/15 1349  BP: 153/63 151/62  Pulse: 75 77  Temp: 98.8 F (37.1 C) 98.8 F (37.1 C)  Resp: 18 18   No intake or output data in the 24 hours ending 08/20/15 1459 Filed Weights   08/15/15 1200  Weight: 67.8 kg (149 lb 7.6 oz)    Exam:   General:  Elderly female not in distress  HEENT: Moist mucosa  Chest: Bilaterally  CVS: Normal S1 murmurs  GI: Distended, tender, bowel sounds present  Musculoskeletal: WARM, no edema,  CNS: Alert and oriented  Data Reviewed: Basic Metabolic Panel:  Recent Labs Lab 08/19/15 1405  NA 138  K 4.1  CL 98*  CO2 33*  GLUCOSE 185*  BUN 9  CREATININE 0.92  CALCIUM 8.8*   Liver Function Tests:  Recent Labs Lab 08/19/15 1405  AST 27  ALT 27  ALKPHOS 46  BILITOT 0.6  PROT 6.0*  ALBUMIN 2.7*   No results for input(s): LIPASE, AMYLASE in the last 168 hours. No results for input(s): AMMONIA in the last 168 hours. CBC:  Recent Labs Lab 08/19/15 1405  WBC 9.8  NEUTROABS 7.2  HGB 11.9*  HCT 34.8*  MCV 92.8  PLT 283   Cardiac Enzymes: No results for input(s): CKTOTAL, CKMB, CKMBINDEX, TROPONINI in the last 168 hours. BNP (last  3 results) No results for input(s): BNP in the last 8760 hours.  ProBNP (last 3 results) No results for input(s): PROBNP in the last 8760 hours.  CBG:  Recent Labs Lab 08/19/15 1136 08/19/15 1707 08/19/15 2206 08/20/15 0644 08/20/15 1158  GLUCAP 146* 148*  222* 166* 163*    Recent Results (from the past 240 hour(s))  C difficile quick scan w PCR reflex     Status: None   Collection Time: 08/19/15 10:57 PM  Result Value Ref Range Status   C Diff antigen NEGATIVE NEGATIVE Final   C Diff toxin NEGATIVE NEGATIVE Final   C Diff interpretation Negative for toxigenic C. difficile  Final  H.pylori Antigen, Stool     Status: None   Collection Time: 08/19/15 10:57 PM  Result Value Ref Range Status   Specimen Description PER RECTUM  Final   Special Requests NONE  Final   H. pylori ag, stool   Final    NOT DETECTED Antimicrobials,proton pump inhibitors and bismuth preparations are known to suppress H.pylori and ingestion of these prior to testing may cause a false negative result. If a negative result is obtained for a patient that has  ingested these  compounds within two weeks prior of performing the H.pylori test, results may be falsely negative and should be repeated with a new specimen two weeks after discontinuing treatment. Performed at [facility]    Report Status 08/20/2015 FINAL  Final     Studies: Dg Abd Portable 1v  08/20/2015  CLINICAL DATA:  Small bowel obstruction. Abdominal distension, postop for back surgery EXAM: PORTABLE ABDOMEN - 1 VIEW COMPARISON:  08/19/2015 FINDINGS: Mild gaseous distended small bowel loops mid abdomen/pelvis. Postsurgical changes lumbar spine with posterior fusion at L2-L5 level. Bilateral hip prosthesis. Significant gaseous distension of the right colon up to 13 cm. Moderate gaseous distension of the transverse colon. Findings highly suspicious for significant ileus or partial obstruction. IMPRESSION: Mild gaseous distended small bowel loops mid abdomen/ pelvis suspicious for ileus Significant gaseous distension of the right colon up to 13 cm. Moderate gaseous distension of transverse colon, highly suspicious for significant ileus or partial obstruction Electronically Signed   By: Lahoma Crocker M.D.   On:  08/20/2015 08:16   Dg Abd Portable 1v  08/19/2015  CLINICAL DATA:  Abdominal pain and distension after back surgery 08/13/2015 EXAM: PORTABLE ABDOMEN - 1 VIEW COMPARISON:  08/16/2015 FINDINGS: Interval increase in the diameter of the transverse colon which is gases sleep distended to about 6 cm. The cecum is again distended, and there are numerous air-filled loops of small bowel as well. No caliber transition identified. Postoperative change lumbar spine with rods noted. IMPRESSION: Findings most consistent with postoperative ileus. Electronically Signed   By: Skipper Cliche M.D.   On: 08/19/2015 12:54    Scheduled Meds: . acidophilus   Oral Daily  . dicyclomine  20 mg Oral TID AC  . docusate sodium  100 mg Oral BID  . insulin aspart  0-5 Units Subcutaneous QHS  . insulin aspart  0-9 Units Subcutaneous TID WC  . insulin glargine  22 Units Subcutaneous QHS  . lisinopril  10 mg Oral Daily  . loratadine  10 mg Oral Daily  . mesalamine  750 mg Oral Daily  . multivitamin with minerals  1 tablet Oral Daily  . pantoprazole (PROTONIX) IV  40 mg Intravenous Q12H  . sodium chloride flush  3 mL Intravenous Q12H  . sucralfate  1 g Oral TID WC &  HS  . tiZANidine  1 mg Oral QHS  . vitamin B-12  3,000 mcg Oral Daily  . vitamin C  1,000 mg Oral Daily  . Vitamin D (Ergocalciferol)  50,000 Units Oral Q14 Days   Continuous Infusions: . sodium chloride 250 mL (08/13/15 1746)  . dextrose 5 % and 0.45 % NaCl with KCl 20 mEq/L 100 mL/hr at 08/19/15 1933      Time spent: [place], [facility] Pager [phone] If 7PM-7AM, please contact night-coverage at [email or web address], password [facility] 08/20/2015, 2:59 PM  LOS: 7 days

## 2015-08-20 NOTE — Care Management Note (Signed)
Case Management Note  Patient Details  Name: Sheri Reid MRN: [id number] Date of Birth: 10-16-1940  Subjective/Objective:                    Action/Plan: Patient continuing with post op ileus. CM following for discharge needs.   Expected Discharge Date:                  Expected Discharge Plan:     In-House Referral:     Discharge planning Services     Post Acute Care Choice:    Choice offered to:     DME Arranged:    DME Agency:     HH Arranged:    HH Agency:     Status of Service:  In process, will continue to follow  Medicare Important Message Given:  Yes Date Medicare IM Given:    Medicare IM give by:    Date Additional Medicare IM Given:    Additional Medicare Important Message give by:     If discussed at [place] of Stay Meetings, dates discussed:    Additional Comments:  Pollie Friar, RN 08/20/2015, 10:36 AM

## 2015-08-21 ENCOUNTER — Inpatient Hospital Stay (HOSPITAL_COMMUNITY): Payer: Medicare Other

## 2015-08-21 ENCOUNTER — Encounter (HOSPITAL_COMMUNITY)
Admission: RE | Disposition: A | Discharge status: Home Health | Payer: Self-pay | Source: Ambulatory Visit | Attending: Neurosurgery

## 2015-08-21 ENCOUNTER — Encounter (HOSPITAL_COMMUNITY): Payer: Self-pay

## 2015-08-21 DIAGNOSIS — K913 Postprocedural intestinal obstruction: Secondary | ICD-10-CM

## 2015-08-21 HISTORY — PX: COLONOSCOPY: SHX5424

## 2015-08-21 LAB — BASIC METABOLIC PANEL
Anion gap: 8 (ref 5–15)
BUN: 5 mg/dL — AB (ref 6–20)
CO2: 25 mmol/L (ref 22–32)
CREATININE: 0.78 mg/dL (ref 0.44–1.00)
Calcium: 8.7 mg/dL — ABNORMAL LOW (ref 8.9–10.3)
Chloride: 104 mmol/L (ref 101–111)
GFR calc Af Amer: >60 mL/min (ref >60)
Glucose, Bld: 93 mg/dL (ref 65–99)
Potassium: 3.6 mmol/L (ref 3.5–5.1)
SODIUM: 137 mmol/L (ref 135–145)

## 2015-08-21 LAB — GLUCOSE, CAPILLARY
GLUCOSE-CAPILLARY: 79 mg/dL (ref 65–99)
GLUCOSE-CAPILLARY: 206 mg/dL — AB (ref 65–99)
Glucose-Capillary: 77 mg/dL (ref 65–99)
Glucose-Capillary: 101 mg/dL — ABNORMAL HIGH (ref 65–99)

## 2015-08-21 SURGERY — COLONOSCOPY
Anesthesia: Moderate Sedation

## 2015-08-21 MED ORDER — MIDAZOLAM HCL 5 MG/5ML IJ SOLN
INTRAMUSCULAR | Status: DC | PRN
  Administered 2015-08-21 (×2): 2 mg via INTRAVENOUS
  Administered 2015-08-21: 1 mg via INTRAVENOUS

## 2015-08-21 MED ORDER — MIDAZOLAM HCL 5 MG/ML IJ SOLN
INTRAMUSCULAR | Status: AC
  Filled 2015-08-21: qty 2

## 2015-08-21 MED ORDER — DEXTROSE-NACL 5-0.9 % IV SOLN: INTRAVENOUS | Status: DC

## 2015-08-21 MED ORDER — IOHEXOL 300 MG/ML  SOLN
100.0000 mL | Freq: Once | INTRAMUSCULAR | Status: AC | PRN
  Administered 2015-08-21: 100 mL via INTRAVENOUS

## 2015-08-21 MED ORDER — FENTANYL CITRATE (PF) 100 MCG/2ML IJ SOLN
INTRAMUSCULAR | Status: DC | PRN
  Administered 2015-08-21 (×4): 25 ug via INTRAVENOUS

## 2015-08-21 MED ORDER — FENTANYL CITRATE (PF) 100 MCG/2ML IJ SOLN
INTRAMUSCULAR | Status: AC
  Filled 2015-08-21: qty 2

## 2015-08-21 MED ORDER — SODIUM CHLORIDE 0.9 % IV SOLN: INTRAVENOUS | Status: DC

## 2015-08-21 NOTE — Progress Notes (Signed)
Physical Therapy Treatment Patient Details Name: Sheri Reid MRN: [id number] DOB: 04/01/1941 Today's Date: 08/21/2015    History of Present Illness Pt is a 75 y.o. female who presents s/p L2-L5 anterior lateral lumbar fusion on 08/14/15.    PT Comments    Pt is progressing well with her mobility despite pain and bloating in the stomach. She was able to walk into the hallway with RW and was self cueing increased foot clearance.  Pt is scheduled to have a colonoscopy to help relieve the gas build up later today. PT will continue to follow acutely and mobilize as able.   Follow Up Recommendations  Home health PT;Supervision for mobility/OOB     Equipment Recommendations  Rolling walker with 5" wheels    Recommendations for Other Services   NA     Precautions / Restrictions Precautions Precautions: Fall;Back Precaution Comments: Reviewed back precautions, lifting restrictions Required Braces or Orthoses: Spinal Brace Spinal Brace: Lumbar corset;Applied in sitting position (difficult due to abdominal bloating and pressure)    Mobility  Bed Mobility Overal bed mobility: Needs Assistance Bed Mobility: Rolling;Sidelying to Sit Rolling: Min assist Sidelying to sit: Min assist       General bed mobility comments: Min assist to help support pelvis during transition to roll, assist at trunk to power up to sitting from left side  Transfers Overall transfer level: Needs assistance Equipment used: Rolling walker (2 wheeled) Transfers: Sit to/from Stand Sit to Stand: Min assist         General transfer comment: Min assist to support trunk over weak legs during transitions. Verbal cues for safe hand placement.   Ambulation/Gait Ambulation/Gait assistance: Min assist Ambulation Distance (Feet): 70 Feet Assistive device: Rolling walker (2 wheeled) Gait Pattern/deviations: Step-through pattern;Shuffle Gait velocity: Decreased Gait velocity interpretation: <1.8 ft/sec, indicative  of risk for recurrent falls General Gait Details: Pt slef cueing to "pick up my feet" PT reinforced good posture and positioning in RW.           Balance Overall balance assessment: Needs assistance Sitting-balance support: Feet supported;Bilateral upper extremity supported Sitting balance-Leahy Scale: Fair     Standing balance support: Bilateral upper extremity supported Standing balance-Leahy Scale: Poor                      Cognition Arousal/Alertness: Awake/alert Behavior During Therapy: WFL for tasks assessed/performed Overall Cognitive Status: Within Functional Limits for tasks assessed                             Pertinent Vitals/Pain Pain Assessment: 0-10 Pain Score: 7  Pain Location: back and abdomen Pain Descriptors / Indicators: Aching;Constant Pain Intervention(s): Limited activity within patient's tolerance;Monitored during session;Repositioned           PT Goals (current goals can now be found in the care plan section) Acute Rehab PT Goals Patient Stated Goal: to get her bowel issues resolved Progress towards PT goals: Progressing toward goals    Frequency  Min 5X/week    PT Plan Current plan remains appropriate       End of Session Equipment Utilized During Treatment: Back brace Activity Tolerance: Patient limited by pain Patient left: in chair;with call bell/phone within reach;with family/visitor present     Time: 1216-1239 PT Time Calculation (min) (ACUTE ONLY): 23 min  Charges:  $Gait Training: 8-22 mins $Therapeutic Activity: 8-22 mins  Barbarann Ehlers [place], [place], DPT [phone]   08/21/2015, 2:42 PM

## 2015-08-21 NOTE — Consult Note (Signed)
Reason for Consult: Post-operative ileus Referring Physician: Neurosurgery  Melynda Ripple HPI: This is a 75 year old female admitted for back and leg pain and s/p lumbar fusion on 08/13/2015.  She was progressing post-operatively, but on 08/16/2015 she started to complain of abdominal discomfort.  The KUB on that day did not reveal any significant findings.  Her pain continued to worsen and a follow up KUB was performed on 08/19/2015 with findings of dilation of the cecum and ascending colon.  There was evidence of air-fluid levels and the cecum was distended to 6 cm.  An NG tube was placed before, but there was not any significant benefit.  Trials of laxatives were also ineffective and she has not had a bowel movement.  A repeat KUB on 08/20/2015 showed a worsening of the colonic distension at 13 cm.  This was followed by a CT scan today, which was negative for a volvulus, but she appears to have a left psoas muscle abscess.  Attempts to minimize narcotic medications were performed to help improve colonic motility in addition to her ambulation.  Past Medical History  Diagnosis Date  . Diabetic peripheral neuropathy ([place])   . Depression     with pain  . GERD (gastroesophageal reflux disease)   . Constipation due to pain medication   . Tremor of unknown origin     family has it  . Ulcerative colitis ([place])   . Type II diabetes mellitus ([place])   . Arthritis     "back" (08/13/2015)  . Chronic lower back pain     Past Surgical History  Procedure Laterality Date  . Joint replacement    . 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 Cataract extraction w/ intraocular lens  implant, bilateral Bilateral   . Colonoscopy    . Anterior lateral lumbar fusion with percutaneous screw 3 level Left 08/13/2015    L2-3 L3-4 L4-5 Anterolateral interbody fusion with percutaneous pedicle screws   . 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 Back surgery    . Anterior lat lumbar fusion Left 08/13/2015    Procedure:  Left Access L2-3 L3-4 L4-5 Anterolateral interbody fusion ;  Surgeon: Erline Levine, MD;  Location: [facility];  Service: Neurosurgery;  Laterality: Left;  Left Access L2-3 L3-4 L4-5 Anterolateral interbody fusion   . Lumbar percutaneous pedicle screw 3 level N/A 08/13/2015    Procedure: L2-3 L3-4 L4-5 Anterolateral interbody fusion with percutaneous pedicle screws;  Surgeon: Erline Levine, MD;  Location: [facility];  Service: Neurosurgery;  Laterality: N/A;  L2-3 L3-4 L4-5 Anterolateral interbody fusion with percutaneous pedicle screws    History reviewed. No pertinent family history.  Social History:  reports that she has quit smoking. Her smoking use included Cigarettes. She has a 45 pack-year smoking history. She has never used smokeless tobacco. She reports that she drinks about 1.2 oz of alcohol per week. She reports that she does not use illicit drugs.  Allergies: No Known Allergies  Medications:  Scheduled: . acidophilus   Oral Daily  . dicyclomine  20 mg Oral TID AC  . docusate sodium  100 mg Oral BID  . insulin aspart  0-5 Units Subcutaneous QHS  . insulin aspart  0-9 Units Subcutaneous TID WC  . insulin glargine  22 Units Subcutaneous QHS  . lisinopril  10 mg Oral Daily  . loratadine  10 mg Oral Daily  . mesalamine  750 mg Oral Daily  . multivitamin with minerals  1 tablet Oral Daily  .  pantoprazole (PROTONIX) IV  40 mg Intravenous Q12H  . sodium chloride flush  3 mL Intravenous Q12H  . sucralfate  1 g Oral TID WC & HS  . tiZANidine  1 mg Oral QHS  . vitamin B-12  3,000 mcg Oral Daily  . vitamin C  1,000 mg Oral Daily  . Vitamin D (Ergocalciferol)  50,000 Units Oral Q14 Days   Continuous: . sodium chloride 250 mL (08/13/15 1746)  . dextrose 5 % and 0.45 % NaCl with KCl 20 mEq/L 100 mL/hr at 08/19/15 1933    Results for orders placed or performed during the hospital encounter of 08/13/15 (from the past 24 hour(s))  Glucose, capillary     Status: Abnormal   Collection  Time: 08/20/15  4:53 PM  Result Value Ref Range   Glucose-Capillary 184 (H) 65 - 99 mg/dL  Glucose, capillary     Status: Abnormal   Collection Time: 08/20/15  9:23 PM  Result Value Ref Range   Glucose-Capillary 242 (H) 65 - 99 mg/dL   Comment 1 Notify RN    Comment 2 Document in Chart   Basic metabolic panel     Status: Abnormal   Collection Time: 08/21/15  4:00 AM  Result Value Ref Range   Sodium 137 135 - 145 mmol/L   Potassium 3.6 3.5 - 5.1 mmol/L   Chloride 104 101 - 111 mmol/L   CO2 25 22 - 32 mmol/L   Glucose, Bld 93 65 - 99 mg/dL   BUN 5 (L) 6 - 20 mg/dL   Creatinine, Ser 0.78 0.44 - 1.00 mg/dL   Calcium 8.7 (L) 8.9 - 10.3 mg/dL   GFR calc non Af Amer >60 >60 mL/min   GFR calc Af Amer >60 >60 mL/min   Anion gap 8 5 - 15  Glucose, capillary     Status: None   Collection Time: 08/21/15  6:48 AM  Result Value Ref Range   Glucose-Capillary 77 65 - 99 mg/dL   Comment 1 Notify RN    Comment 2 Document in Chart      Ct Abdomen Pelvis W Contrast  08/21/2015  CLINICAL DATA:  Nausea, vomiting, and abdominal pain. Recent lumbar laminectomy. EXAM: CT ABDOMEN AND PELVIS WITH CONTRAST TECHNIQUE: Multidetector CT imaging of the abdomen and pelvis was performed using the standard protocol following bolus administration of intravenous contrast. CONTRAST:  176m OMNIPAQUE IOHEXOL 300 MG/ML  SOLN COMPARISON:  No prior CT. FINDINGS: Lower chest: 2.2 cm subpleural nodular opacity in the right lower lobe with questionable surrounding spiculation. Linear atelectasis in the right middle lobe. Coronary artery calcifications. Liver: No focal lesion. Hepatobiliary: Gallbladder decompressed.  No calcified stone. Pancreas: No ductal dilatation or inflammation. Atrophic parenchyma. Spleen: Normal. Adrenal glands: 2.4 x 1.6 cm left adrenal nodule. Mild right adrenal thickening. Kidneys: Symmetric renal enhancement and excretion. There is prominence of both proximal renal collecting systems without ureteral  dilatation. 2.9 cm are left renal cyst. Stomach/Bowel: Stomach is decompressed. Proximal small bowel loops are normal. Mild gaseous distention of pelvic and distal small bowel loops. Gaseous distention of cecum and ascending colon, cecum measuring up to 10 mm. No mesenteric twisting or volvulus. Liquid stool throughout the colon with distention of the ascending, transverse, and proximal descending colon. Liquid and solid stool in the sigmoid colon, with distal colonic diverticulosis. Portions of the sigmoid colon are obscured by streak artifact from hip prosthesis. No colonic wall thickening. Vascular/Lymphatic: No retroperitoneal adenopathy. Abdominal aorta is normal in caliber. Moderate atherosclerosis  without aneurysm. Reproductive: Uterus likely remains in situ, detailed evaluation obscured by streak artifact. Bladder: Distended but obscured. Other: Patient is post L2 through L5 posterior fusion. There is an irregular air in fluid collection within the left psoas muscle adjacent to the surgical hardware. This measures 5.3 x 3.7 x 8.4 cm. Air in fluid collection in the surgical bed posteriorly, with scattered air tracking about the subcutaneous tissues of the left flank. Minimal air tracks anterior to the left iliopsoas muscle and retroperitoneum. No intraperitoneal fluid collection. Musculoskeletal: Posterior fusion L2 through L5 with interbody spacers. No bony destructive change. Air and fluid in the surgical bed posteriorly without rim enhancement. Bilateral hip arthroplasties. IMPRESSION: 1. Post recent lumbar spine surgery with irregular air and fluid collection in the left psoas muscle, 5.3 x 3.7 x 8.4 cm, concerning for postoperative abscess. Air tracks along the iliopsoas muscle in the left retroperitoneum, as well as subcutaneous tissues. Ill-defined air and fluid in the surgical bed posteriorly is likely sequela of recent surgery without rim enhancement of the subcutaneous fluid. 2. Distended  fluid-filled colon, most significantly affecting the ascending colon and cecum. Findings suggest colonic ileus. No evidence of volvulus. Mild small bowel dilatation distally. 3. Right lower lobe 2.2 cm subpleural nodular opacity, partially included in the field of view, however concerning for pulmonary nodule/malignancy, primary bronchogenic versus metastatic. Recommend chest CT for characterization. There is a 2.4 cm left adrenal nodule, nonspecific, however concerning for metastasis in this setting. No prior exams available for comparison. These results will be called to the ordering clinician or representative by the Radiologist Assistant, and communication documented in the PACS or zVision Dashboard. Electronically Signed   By: Jeb Levering M.D.   On: 08/21/2015 03:04   Dg Abd Portable 1v  08/20/2015  CLINICAL DATA:  Small bowel obstruction. Abdominal distension, postop for back surgery EXAM: PORTABLE ABDOMEN - 1 VIEW COMPARISON:  08/19/2015 FINDINGS: Mild gaseous distended small bowel loops mid abdomen/pelvis. Postsurgical changes lumbar spine with posterior fusion at L2-L5 level. Bilateral hip prosthesis. Significant gaseous distension of the right colon up to 13 cm. Moderate gaseous distension of the transverse colon. Findings highly suspicious for significant ileus or partial obstruction. IMPRESSION: Mild gaseous distended small bowel loops mid abdomen/ pelvis suspicious for ileus Significant gaseous distension of the right colon up to 13 cm. Moderate gaseous distension of transverse colon, highly suspicious for significant ileus or partial obstruction Electronically Signed   By: Lahoma Crocker M.D.   On: 08/20/2015 08:16   Dg Abd Portable 1v  08/19/2015  CLINICAL DATA:  Abdominal pain and distension after back surgery 08/13/2015 EXAM: PORTABLE ABDOMEN - 1 VIEW COMPARISON:  08/16/2015 FINDINGS: Interval increase in the diameter of the transverse colon which is gases sleep distended to about 6 cm. The  cecum is again distended, and there are numerous air-filled loops of small bowel as well. No caliber transition identified. Postoperative change lumbar spine with rods noted. IMPRESSION: Findings most consistent with postoperative ileus. Electronically Signed   By: Skipper Cliche M.D.   On: 08/19/2015 12:54    ROS:  As stated above in the HPI otherwise negative.  Blood pressure 154/73, pulse 88, temperature 98.3 F (36.8 C), temperature source Oral, resp. rate 18, height 5' 5"  (1.651 m), weight 67.8 kg (149 lb 7.6 oz), SpO2 94 %.    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 3) ABM pain.   The patient states that she feels as if she is going to "explode".  Her cecal diameter is at 13 cm and she requires decompression.  There is no evidence of volvulus.  Unfortunately, the decompression, in most instances is only temporary, but I need to intervene to avoid perforation.  Plan: 1) Colonic decompression today. Niyam Bisping D 08/21/2015, 12:12 PM

## 2015-08-21 NOTE — Progress Notes (Signed)
Patient unhappy about having have an NG tube AVSS Awake, alert, oriented Moves legs well Stable CT reviewed, left psoas fluid collection consistent with a postoperative hematoma/seroma after lateral interbody fusion. Unlikely to be an abscess this early pain in a patient who is afebrile Continue medical management of postoperative ileus

## 2015-08-21 NOTE — Progress Notes (Signed)
[facility] PROGRESS NOTE  BYRD TERRERO TIW:[id number] DOB: [birth year] DOA: 08/13/2015 PCP: Talmage Coin, MD  Brief narrative 75 year old female with lumbar spondylolisthesis, insulin-dependent diabetes mellitus who was admitted for elective decompressive lumbar laminectomy (left axis L2-L5 anterior lateral interbody fusion with percutaneous pedicle screws on 08/13/2015). On 08/19/2015 hospitalist was consulted for episodes of intermittent hypoglycemia with ongoing nausea, vomiting,  and abdominal pain. Patient did not have any bowel movement since 08/13/2015. She had an NG tube placed for about 2 days for decompression that was removed. She had loose bowel movement on 3/6 following an excision of laxatives. Exam she had significant abdominal distention with abdominal x-ray showing partial postoperative ileus.    Assessment/Plan: Hypoglycemia Suspected due to poor by mouth intake with ongoing nausea and vomiting and a shift getting full dose of Lantus. Lantus dose reduce by half to 22 units daily along with sliding scale coverage and fingersticks much stable. Metformin and Victoza have been discontinued. -She now nothing by mouth and CBG in the 70s. Will hold Lantus and monitor on sliding scale coverage only. Has been placed on D5.  Abdominal distention secondary to colonic obstruction Has been ongoing for last few days. CT abdomen done showed significant colonic distention suggestive of ogilivie's home. Patient made nothing by mouth and order for NG tube placement for decompression. Continue IV fluids. Keep k>4. Discontinued Vicodin. Consulted GI and Dr. Benson Norway plans on colonic decompression today    ? Left psoas muscle abscess As commented on CT. Images reviewed by neurosurgery and recommend this is more likely postoperative seroma versus hematoma. I have the patient does not have any clinical signs or symptoms of infection or abscess. Her low back and spine exam are unremarkable.  Lumbar  spinal surgery Per primary team.   Right lower lobe pulmonary nodule Will  follow-up with CT chest with contrast once acute issues resolve. Also has 2.4 and image her left adrenal nodule.  Code Status: Full code Family Communication: Discussed with son on the phone. Disposition Plan: Inpatient monitoring    Procedures:  Abdominal CT  Antibiotics:  None  HPI/Subjective: See and examined. As persistent abdominal pain with distention. Has not had bowel movement today. Reports nausea but no vomiting. CT abdomen and pelvis done earlier this morning showing persistent colonic distention with ileus and markedly distended sigmoid colon.    Objective: Filed Vitals:   08/21/15 0644 08/21/15 1028  BP: 147/67 154/73  Pulse: 89 88  Temp: 99.1 F (37.3 C) 98.3 F (36.8 C)  Resp: 18 18    Intake/Output Summary (Last 24 hours) at 08/21/15 1255 Last data filed at 08/21/15 0115  Gross per 24 hour  Intake      0 ml  Output    200 ml  Net   -200 ml   Filed Weights   08/15/15 1200  Weight: 67.8 kg (149 lb 7.6 oz)    Exam:   General:  Elderly female In distress with pain  HEENT: Moist mucosa  Chest: Clear Bilaterally  CVS: Normal S1 murmurs  GI: Currently Distended, tender, bowel sounds present  Musculoskeletal: WARM, no edema,Clean dressing over her low back, nontender  CNS: Alert and oriented  Data Reviewed: Basic Metabolic Panel:  Recent Labs Lab 08/19/15 1405 08/21/15 0400  NA 138 137  K 4.1 3.6  CL 98* 104  CO2 33* 25  GLUCOSE 185* 93  BUN 9 5*  CREATININE 0.92 0.78  CALCIUM 8.8* 8.7*   Liver Function Tests:  Recent Labs Lab  08/19/15 1405  AST 27  ALT 27  ALKPHOS 46  BILITOT 0.6  PROT 6.0*  ALBUMIN 2.7*   No results for input(s): LIPASE, AMYLASE in the last 168 hours. No results for input(s): AMMONIA in the last 168 hours. CBC:  Recent Labs Lab 08/19/15 1405  WBC 9.8  NEUTROABS 7.2  HGB 11.9*  HCT 34.8*  MCV 92.8  PLT 283    Cardiac Enzymes: No results for input(s): CKTOTAL, CKMB, CKMBINDEX, TROPONINI in the last 168 hours. BNP (last 3 results) No results for input(s): BNP in the last 8760 hours.  ProBNP (last 3 results) No results for input(s): PROBNP in the last 8760 hours.  CBG:  Recent Labs Lab 08/20/15 1158 08/20/15 1653 08/20/15 2123 08/21/15 0648 08/21/15 1149  GLUCAP 163* 184* 242* 77 101*    Recent Results (from the past 240 hour(s))  C difficile quick scan w PCR reflex     Status: None   Collection Time: 08/19/15 10:57 PM  Result Value Ref Range Status   C Diff antigen NEGATIVE NEGATIVE Final   C Diff toxin NEGATIVE NEGATIVE Final   C Diff interpretation Negative for toxigenic C. difficile  Final  H.pylori Antigen, Stool     Status: None   Collection Time: 08/19/15 10:57 PM  Result Value Ref Range Status   Specimen Description PER RECTUM  Final   Special Requests NONE  Final   H. pylori ag, stool   Final    NOT DETECTED Antimicrobials,proton pump inhibitors and bismuth preparations are known to suppress H.pylori and ingestion of these prior to testing may cause a false negative result. If a negative result is obtained for a patient that has  ingested these  compounds within two weeks prior of performing the H.pylori test, results may be falsely negative and should be repeated with a new specimen two weeks after discontinuing treatment. Performed at [facility]    Report Status 08/20/2015 FINAL  Final     Studies: Ct Abdomen Pelvis W Contrast  08/21/2015  CLINICAL DATA:  Nausea, vomiting, and abdominal pain. Recent lumbar laminectomy. EXAM: CT ABDOMEN AND PELVIS WITH CONTRAST TECHNIQUE: Multidetector CT imaging of the abdomen and pelvis was performed using the standard protocol following bolus administration of intravenous contrast. CONTRAST:  139m OMNIPAQUE IOHEXOL 300 MG/ML  SOLN COMPARISON:  No prior CT. FINDINGS: Lower chest: 2.2 cm subpleural nodular opacity in the  right lower lobe with questionable surrounding spiculation. Linear atelectasis in the right middle lobe. Coronary artery calcifications. Liver: No focal lesion. Hepatobiliary: Gallbladder decompressed.  No calcified stone. Pancreas: No ductal dilatation or inflammation. Atrophic parenchyma. Spleen: Normal. Adrenal glands: 2.4 x 1.6 cm left adrenal nodule. Mild right adrenal thickening. Kidneys: Symmetric renal enhancement and excretion. There is prominence of both proximal renal collecting systems without ureteral dilatation. 2.9 cm are left renal cyst. Stomach/Bowel: Stomach is decompressed. Proximal small bowel loops are normal. Mild gaseous distention of pelvic and distal small bowel loops. Gaseous distention of cecum and ascending colon, cecum measuring up to 10 mm. No mesenteric twisting or volvulus. Liquid stool throughout the colon with distention of the ascending, transverse, and proximal descending colon. Liquid and solid stool in the sigmoid colon, with distal colonic diverticulosis. Portions of the sigmoid colon are obscured by streak artifact from hip prosthesis. No colonic wall thickening. Vascular/Lymphatic: No retroperitoneal adenopathy. Abdominal aorta is normal in caliber. Moderate atherosclerosis without aneurysm. Reproductive: Uterus likely remains in situ, detailed evaluation obscured by streak artifact. Bladder: Distended but obscured.  Other: Patient is post L2 through L5 posterior fusion. There is an irregular air in fluid collection within the left psoas muscle adjacent to the surgical hardware. This measures 5.3 x 3.7 x 8.4 cm. Air in fluid collection in the surgical bed posteriorly, with scattered air tracking about the subcutaneous tissues of the left flank. Minimal air tracks anterior to the left iliopsoas muscle and retroperitoneum. No intraperitoneal fluid collection. Musculoskeletal: Posterior fusion L2 through L5 with interbody spacers. No bony destructive change. Air and fluid in the  surgical bed posteriorly without rim enhancement. Bilateral hip arthroplasties. IMPRESSION: 1. Post recent lumbar spine surgery with irregular air and fluid collection in the left psoas muscle, 5.3 x 3.7 x 8.4 cm, concerning for postoperative abscess. Air tracks along the iliopsoas muscle in the left retroperitoneum, as well as subcutaneous tissues. Ill-defined air and fluid in the surgical bed posteriorly is likely sequela of recent surgery without rim enhancement of the subcutaneous fluid. 2. Distended fluid-filled colon, most significantly affecting the ascending colon and cecum. Findings suggest colonic ileus. No evidence of volvulus. Mild small bowel dilatation distally. 3. Right lower lobe 2.2 cm subpleural nodular opacity, partially included in the field of view, however concerning for pulmonary nodule/malignancy, primary bronchogenic versus metastatic. Recommend chest CT for characterization. There is a 2.4 cm left adrenal nodule, nonspecific, however concerning for metastasis in this setting. No prior exams available for comparison. These results will be called to the ordering clinician or representative by the Radiologist Assistant, and communication documented in the PACS or zVision Dashboard. Electronically Signed   By: Jeb Levering M.D.   On: 08/21/2015 03:04   Dg Abd Portable 1v  08/20/2015  CLINICAL DATA:  Small bowel obstruction. Abdominal distension, postop for back surgery EXAM: PORTABLE ABDOMEN - 1 VIEW COMPARISON:  08/19/2015 FINDINGS: Mild gaseous distended small bowel loops mid abdomen/pelvis. Postsurgical changes lumbar spine with posterior fusion at L2-L5 level. Bilateral hip prosthesis. Significant gaseous distension of the right colon up to 13 cm. Moderate gaseous distension of the transverse colon. Findings highly suspicious for significant ileus or partial obstruction. IMPRESSION: Mild gaseous distended small bowel loops mid abdomen/ pelvis suspicious for ileus Significant gaseous  distension of the right colon up to 13 cm. Moderate gaseous distension of transverse colon, highly suspicious for significant ileus or partial obstruction Electronically Signed   By: Lahoma Crocker M.D.   On: 08/20/2015 08:16    Scheduled Meds: . acidophilus   Oral Daily  . dicyclomine  20 mg Oral TID AC  . docusate sodium  100 mg Oral BID  . insulin aspart  0-5 Units Subcutaneous QHS  . insulin aspart  0-9 Units Subcutaneous TID WC  . insulin glargine  22 Units Subcutaneous QHS  . lisinopril  10 mg Oral Daily  . loratadine  10 mg Oral Daily  . mesalamine  750 mg Oral Daily  . multivitamin with minerals  1 tablet Oral Daily  . pantoprazole (PROTONIX) IV  40 mg Intravenous Q12H  . sodium chloride flush  3 mL Intravenous Q12H  . sucralfate  1 g Oral TID WC & HS  . tiZANidine  1 mg Oral QHS  . vitamin B-12  3,000 mcg Oral Daily  . vitamin C  1,000 mg Oral Daily  . Vitamin D (Ergocalciferol)  50,000 Units Oral Q14 Days   Continuous Infusions: . sodium chloride 250 mL (08/13/15 1746)  . dextrose 5 % and 0.45 % NaCl with KCl 20 mEq/L 100 mL/hr at 08/19/15 1933  Time spent: [place], [place]  [facility] Pager [phone] If 7PM-7AM, please contact night-coverage at [email or web address], password [email or web address] 08/21/2015, 12:55 PM  LOS: 8 days

## 2015-08-21 NOTE — Op Note (Signed)
[facility] Patient Name: Sheri Reid Procedure Date : 08/21/2015 MRN: [id number] Attending MD: Carol Ada , MD Date of Birth: 1940-08-03 CSN: [id number] Age: 75 Admit Type: Inpatient Procedure:                Colonoscopy Indications:              Abnormal abdominal x-ray of the GI tract Providers:                Carol Ada, MD, Laverta Baltimore, RN, Despina Pole, Technician Referring MD:              Medicines:                Fentanyl 100 micrograms IV, Midazolam 5 mg IV Complications:            No immediate complications. Estimated Blood Loss:     Estimated blood loss: none. Procedure:                Pre-Anesthesia Assessment:                           - Prior to the procedure, a History and Physical                            was performed, and patient medications and                            allergies were reviewed. The patient's tolerance of                            previous anesthesia was also reviewed. The risks                            and benefits of the procedure and the sedation                            options and risks were discussed with the patient.                            All questions were answered, and informed consent                            was obtained. Prior Anticoagulants: The patient has                            taken no previous anticoagulant or antiplatelet                            agents. ASA Grade Assessment: III - A patient with                            severe systemic disease. After reviewing the risks  and benefits, the patient was deemed in                            satisfactory condition to undergo the procedure.                           - Sedation was administered by an endoscopy nurse.                            The sedation level attained was moderate.                           After obtaining informed consent, the colonoscope   was passed under direct vision. Throughout the                            procedure, the patient's blood pressure, pulse, and                            oxygen saturations were monitored continuously. The                            EC-[id number] ([id number]) scope was introduced through                            the anus and advanced to the the ascending colon                            for evaluation. This was the intended extent. The                            colonoscopy was performed with moderate difficulty                            due to poor endoscopic visualization and the                            patient's discomfort during the procedure.                            Successful completion of the procedure was aided by                            increasing the dose of sedation medication,                            applying abdominal pressure and lavage. The patient                            tolerated the procedure. The quality of the bowel                            preparation was adequate. No anatomical landmarks  were photographed. Scope In: 2:52:10 PM Scope Out: 3:19:00 PM Scope Withdrawal Time: 0 hours 6 minutes 0 seconds  Total Procedure Duration: 0 hours 26 minutes 50 seconds  Findings:      The lumen of the ascending colon and cecum was significantly dilated.       Decompression was performed with suctioning of the luminal air. External       palpation of the abdomen revealed a significant drop in her distension       and the patient reported an improvement. A decompression tube was not       inserted as these small caliber tubes clog shortly after deployment. Impression:               - Dilated in the ascending colon and in the cecum.                           - No specimens collected. Moderate Sedation:      Moderate (conscious) sedation was administered by the endoscopy nurse       and supervised by the endoscopist. The following parameters were        monitored: oxygen saturation, heart rate, blood pressure, respiratory       rate, EKG, adequacy of pulmonary ventilation, and response to care. Recommendation:           - Patient has a contact number available for                            emergencies. The signs and symptoms of potential                            delayed complications were discussed with the                            patient. Return to normal activities tomorrow.                            Written discharge instructions were provided to the                            patient.                           - Resume previous diet.                           - Continue present medications.                           - No repeat colonoscopy due to as this was a                            therapeutic procedure.                           - Follow and correct electrolytes if necessary.                           - Minimize narcotics.                           -  Ambulation. Procedure Code(s):        --- Professional ---                           [phone], 72, Colonoscopy, flexible; diagnostic,                            including collection of specimen(s) by brushing or                            washing, when performed (separate procedure) Diagnosis Code(s):        --- Professional ---                           K59.39, Other megacolon                           R93.3, Abnormal findings on diagnostic imaging of                            other parts of digestive tract CPT copyright 2016 [employer]. All rights reserved. The codes documented in this report are preliminary and upon coder review may  be revised to meet current compliance requirements. Carol Ada, MD Carol Ada, MD 08/21/2015 3:29:54 PM This report has been signed electronically. Number of Addenda: 0

## 2015-08-22 ENCOUNTER — Inpatient Hospital Stay (HOSPITAL_COMMUNITY): Payer: Medicare Other

## 2015-08-22 ENCOUNTER — Encounter (HOSPITAL_COMMUNITY): Payer: Self-pay | Admitting: Gastroenterology

## 2015-08-22 DIAGNOSIS — K598 Other specified functional intestinal disorders: Secondary | ICD-10-CM

## 2015-08-22 DIAGNOSIS — K5100 Ulcerative (chronic) pancolitis without complications: Secondary | ICD-10-CM

## 2015-08-22 DIAGNOSIS — K567 Ileus, unspecified: Secondary | ICD-10-CM | POA: Insufficient documentation

## 2015-08-22 LAB — GLUCOSE, CAPILLARY
GLUCOSE-CAPILLARY: 230 mg/dL — AB (ref 65–99)
Glucose-Capillary: 186 mg/dL — ABNORMAL HIGH (ref 65–99)
Glucose-Capillary: 242 mg/dL — ABNORMAL HIGH (ref 65–99)

## 2015-08-22 MED ORDER — POLYETHYLENE GLYCOL 3350 17 G PO PACK
17.0000 g | PACK | Freq: Every day | ORAL | Status: DC
  Administered 2015-08-23 – 2015-08-26 (×4): 17 g via ORAL
  Filled 2015-08-22 (×4): qty 1

## 2015-08-22 MED ORDER — GLYCERIN (LAXATIVE) 2.1 G RE SUPP
1.0000 | Freq: Every day | RECTAL | Status: DC
  Administered 2015-08-23 – 2015-08-25 (×3): 1 via RECTAL
  Filled 2015-08-22 (×10): qty 1

## 2015-08-22 MED ORDER — INSULIN GLARGINE 100 UNIT/ML ~~LOC~~ SOLN
22.0000 [IU] | Freq: Every day | SUBCUTANEOUS | Status: DC
  Administered 2015-08-22: 22 [IU] via SUBCUTANEOUS
  Filled 2015-08-22 (×2): qty 0.22

## 2015-08-22 NOTE — Progress Notes (Addendum)
On Call for Dr. Loann Quill [facility] Progress Note  Subjective:  S/P colonoscopy 08/21/15:- Dilated in the ascending colon and in the cecum. - No specimens collected..This morning feels as if abd is again getting tight. No nausea or vomiting. Passed a small amt gas but no BM.   Objective:  Vital signs in last 24 hours: Temp:  [97.8 F (36.6 C)-99.3 F (37.4 C)] 97.9 F (36.6 C) (03/11 0956) Pulse Rate:  [77-106] 84 (03/11 0956) Resp:  [11-20] 15 (03/11 0956) BP: (115-205)/(46-88) 131/69 mmHg (03/11 0956) SpO2:  [94 %-100 %] 96 % (03/11 0956) Last BM Date: 08/22/15 General:   Alert,  Well-developed,    in NAD Heart:  Regular rate and rhythm; no murmurs Pulm;lungs clear Abdomen:  Soft, distended, hypoactive BS  Extremities:  Without edema.  Lab Results:  Recent Labs  08/19/15 1405  WBC 9.8  HGB 11.9*  HCT 34.8*  PLT 283   BMET  Recent Labs  08/19/15 1405 08/21/15 0400  NA 138 137  K 4.1 3.6  CL 98* 104  CO2 33* 25  GLUCOSE 185* 93  BUN 9 5*  CREATININE 0.92 0.78  CALCIUM 8.8* 8.7*    Ct Abdomen Pelvis W Contrast  08/21/2015  CLINICAL DATA:  Nausea, vomiting, and abdominal pain. Recent lumbar laminectomy. EXAM: CT ABDOMEN AND PELVIS WITH CONTRAST TECHNIQUE: Multidetector CT imaging of the abdomen and pelvis was performed using the standard protocol following bolus administration of intravenous contrast. CONTRAST:  135m OMNIPAQUE IOHEXOL 300 MG/ML  SOLN COMPARISON:  No prior CT. FINDINGS: Lower chest: 2.2 cm subpleural nodular opacity in the right lower lobe with questionable surrounding spiculation. Linear atelectasis in the right middle lobe. Coronary artery calcifications. Liver: No focal lesion. Hepatobiliary: Gallbladder decompressed.  No calcified stone. Pancreas: No ductal dilatation or inflammation. Atrophic parenchyma. Spleen: Normal. Adrenal glands: 2.4 x 1.6 cm left adrenal nodule. Mild right adrenal thickening. Kidneys: Symmetric renal  enhancement and excretion. There is prominence of both proximal renal collecting systems without ureteral dilatation. 2.9 cm are left renal cyst. Stomach/Bowel: Stomach is decompressed. Proximal small bowel loops are normal. Mild gaseous distention of pelvic and distal small bowel loops. Gaseous distention of cecum and ascending colon, cecum measuring up to 10 mm. No mesenteric twisting or volvulus. Liquid stool throughout the colon with distention of the ascending, transverse, and proximal descending colon. Liquid and solid stool in the sigmoid colon, with distal colonic diverticulosis. Portions of the sigmoid colon are obscured by streak artifact from hip prosthesis. No colonic wall thickening. Vascular/Lymphatic: No retroperitoneal adenopathy. Abdominal aorta is normal in caliber. Moderate atherosclerosis without aneurysm. Reproductive: Uterus likely remains in situ, detailed evaluation obscured by streak artifact. Bladder: Distended but obscured. Other: Patient is post L2 through L5 posterior fusion. There is an irregular air in fluid collection within the left psoas muscle adjacent to the surgical hardware. This measures 5.3 x 3.7 x 8.4 cm. Air in fluid collection in the surgical bed posteriorly, with scattered air tracking about the subcutaneous tissues of the left flank. Minimal air tracks anterior to the left iliopsoas muscle and retroperitoneum. No intraperitoneal fluid collection. Musculoskeletal: Posterior fusion L2 through L5 with interbody spacers. No bony destructive change. Air and fluid in the surgical bed posteriorly without rim enhancement. Bilateral hip arthroplasties. IMPRESSION: 1. Post recent lumbar spine surgery with irregular air and fluid collection in the left psoas muscle, 5.3 x 3.7 x 8.4 cm, concerning for postoperative abscess. Air tracks along the iliopsoas muscle in the  left retroperitoneum, as well as subcutaneous tissues. Ill-defined air and fluid in the surgical bed posteriorly is  likely sequela of recent surgery without rim enhancement of the subcutaneous fluid. 2. Distended fluid-filled colon, most significantly affecting the ascending colon and cecum. Findings suggest colonic ileus. No evidence of volvulus. Mild small bowel dilatation distally. 3. Right lower lobe 2.2 cm subpleural nodular opacity, partially included in the field of view, however concerning for pulmonary nodule/malignancy, primary bronchogenic versus metastatic. Recommend chest CT for characterization. There is a 2.4 cm left adrenal nodule, nonspecific, however concerning for metastasis in this setting. No prior exams available for comparison. These results will be called to the ordering clinician or representative by the Radiologist Assistant, and communication documented in the PACS or zVision Dashboard. Electronically Signed   By: Jeb Levering M.D.   On: 08/21/2015 03:04    ASSESSMENT/PLAN:  This is a 75 year old female admitted for back and leg pain and s/p lumbar fusion on 08/13/2015. She was progressing post-operatively, but on 08/16/2015 she started to complain of abdominal discomfort.KUB was performed on 08/19/2015 with findings of dilation of the cecum and ascending colon.An NG tube was placed before, but there was not any significant benefit. Trials of laxatives were also ineffective and she has not had a bowel movement. A repeat KUB on 08/20/2015 showed a worsening of the colonic distension at 13 cm. CT abd with iliopsoas abscess. AlsoDistended fluid-filled colon, most significantly affecting the ascending colon and cecum. Findings suggest colonic ileus.Had colonoscopy with decompression yesterday. This morning feels as if abd again becoming distended. Passing only very small amts of gass. Will check KUB. Glycerin suppository daily. Miralax daily. Try to minimize use of pain meds. Frequent position changes. Ambulation.    LOS: 9 days   Hvozdovic, Vita Barley PA-C 08/22/2015, Pager [phone] Mon-Fri  8a-5p [phone] after 5p, weekends, holidays     [facility] Attending   I have taken an interval history, reviewed the chart and examined the patient. I agree with the Advanced Practitioner's note, impression and recommendations.   Post spinal surgery colonic ileus - post-op issue vs Oglivie's Hx UC - does not seem to be causative but keep in mind ? L Iliopsoas abscess - NSU thinks post-op normal finding  She is improved after colonic decompression - clinical and xray done today but certainly not resolved  Will place a rectal tube, recheck xray in AM IM Tx lytes, etc  Gatha Mayer, MD, [facility] [phone] (pager) [phone] after 5 PM, weekends and holidays  08/22/2015 6:18 PM

## 2015-08-22 NOTE — Progress Notes (Signed)
Patient ID: Sheri Reid, female   DOB: [birth year], 75 y.o.   MRN: [id number] Patient reports significant improvement after colonoscopy yesterday  Strength out of 5 incisions clean dry and intact  Continue to mobilize with physical occupational therapy appreciate gastroenterology's help with managing her postoperative ileus.

## 2015-08-22 NOTE — Progress Notes (Signed)
Physical Therapy Treatment Patient Details Name: Sheri Reid MRN: [id number] DOB: [birth year] Today's Date: 08/22/2015    History of Present Illness Pt is a 75 y.o. female who presents s/p L2-L5 anterior lateral lumbar fusion on 08/14/15. developed ileus with colonoscopy 3/10 and relief      PT Comments    Patient moving much better now that her abdomen feels better. She does require min assist with bed mobility when she does not use the bedrail. She states her son has a [place] and she will have him provide a hospital bed for her to use. (?accuracy)   Follow Up Recommendations  Home health PT;Supervision for mobility/OOB     Equipment Recommendations  Rolling walker with 5" wheels;Hospital bed (per pt, son can arrange for hospital bed)    Recommendations for Other Services       Precautions / Restrictions Precautions Precautions: Fall;Back Precaution Comments: patient able to state 3/3 and only 3 vc's to avoid twisting Required Braces or Orthoses: Spinal Brace Spinal Brace: Lumbar corset;Applied in sitting position    Mobility  Bed Mobility Overal bed mobility: Needs Assistance Bed Mobility: Rolling;Sidelying to Sit Rolling: Min assist Sidelying to sit: Min assist       General bed mobility comments: HOB flat and no rail. Pt reports her son can arrange a hospital bed and plans to do so (he has DME company?)  Transfers Overall transfer level: Needs assistance Equipment used: Rolling walker (2 wheeled) Transfers: Sit to/from Stand Sit to Stand: Supervision         General transfer comment: no cues or assist needed  Ambulation/Gait Ambulation/Gait assistance: Min assist Ambulation Distance (Feet): 200 Feet Assistive device: Rolling walker (2 wheeled) Gait Pattern/deviations: Step-through pattern;Decreased stride length;Shuffle;Decreased dorsiflexion - right;Decreased dorsiflexion - left Gait velocity: Decreased Gait velocity interpretation: Below normal  speed for age/gender General Gait Details: Pt self cueing to "pick up my feet" PT also cued for heelstrike with limited ability to do so. Pt with one LOB when ankle nearly rolled over. Pt denied injury. reinforced good posture and positioning in RW.    Stairs            Wheelchair Mobility    Modified Rankin (Stroke Patients Only)       Balance     Sitting balance-Leahy Scale: Fair       Standing balance-Leahy Scale: Poor                      Cognition Arousal/Alertness: Awake/alert Behavior During Therapy: WFL for tasks assessed/performed Overall Cognitive Status: Within Functional Limits for tasks assessed                      Exercises      General Comments        Pertinent Vitals/Pain Pain Assessment: 0-10 Pain Score: 6  Pain Location: back more than abdomen Pain Descriptors / Indicators: Guarding Pain Intervention(s): Limited activity within patient's tolerance;Monitored during session;Premedicated before session;Repositioned    Home Living                      Prior Function            PT Goals (current goals can now be found in the care plan section) Acute Rehab PT Goals Patient Stated Goal: to feel better PT Goal Formulation: With patient Time For Goal Achievement: 08/29/15 Potential to Achieve Goals: Good Progress towards PT goals: Progressing toward goals (timeframe  extended)    Frequency  Min 5X/week    PT Plan Current plan remains appropriate    Co-evaluation             End of Session Equipment Utilized During Treatment: Back brace;Gait belt Activity Tolerance: Patient tolerated treatment well Patient left: in chair;with call bell/phone within reach;with chair alarm set     Time: 1753-0104 PT Time Calculation (min) (ACUTE ONLY): 32 min  Charges:  $Gait Training: 23-37 mins                    G Codes:      Kingston Guiles 09/12/2015, 12:27 PM Pager [phone]

## 2015-08-22 NOTE — Progress Notes (Signed)
[facility] PROGRESS NOTE  Sheri Reid HYQ:[id number] DOB: 1940/08/12 DOA: 08/13/2015 PCP: Talmage Coin, MD  Brief narrative 75 year old female with lumbar spondylolisthesis, insulin-dependent diabetes mellitus who was admitted for elective decompressive lumbar laminectomy (left axis L2-L5 anterior lateral interbody fusion with percutaneous pedicle screws on 08/13/2015). On 08/19/2015 hospitalist was consulted for episodes of intermittent hypoglycemia with ongoing nausea, vomiting,  and abdominal pain. Patient did not have any bowel movement since 08/13/2015. She had an NG tube placed for about 2 days for decompression that was removed. She had loose bowel movement on 3/6 following an excision of laxatives. Exam she had significant abdominal distention with abdominal x-ray showing partial postoperative ileus.    Assessment/Plan: Hypoglycemia Suspected due to poor by mouth intake with ongoing nausea and vomiting and a shift getting full dose of Lantus. Lantus dose reduce by half to 22 units daily along with sliding scale coverage and fingersticks much stable. Metformin and Victoza have been discontinued.   Abdominal distention secondary to colonic obstruction  CT abdomen done showed significant colonic distention suggestive of ogilivie's home.  Underwent colonoscopy with decompression on 08/21/2015. Showed significant dilatation the ascending colon and into cecum. Symptoms better but still has significant gaseous distention. Has been passing gas but no bowel movement. Keep k>4. Discontinued Vicodin.  -Appreciate GI follow-up. Ordered repeat KUB. Recommend avoiding narcotics. Daily glycerin suppositories and MiraLAX. Recommend frequent position changes and ambulation.serial abdominal exam.    ? Left psoas muscle abscess As commented on CT. Images reviewed by neurosurgery and recommend this is more likely postoperative seroma versus hematoma. Clinical signs of infection.  Lumbar spinal  surgery Per primary team.   Right lower lobe pulmonary nodule  follow-up with CT chest with contrast once acute issues resolve. Also has 2.4 and image her left adrenal nodule.  Code Status: Full code Family Communication: None at bedside Disposition Plan: Inpatient monitoring her now until GI issues resolve.    Procedures:  Abdominal CT  Antibiotics:  None  HPI/Subjective: See and examined. Colonic decompression done for Ogilvie's syndrome. Symptoms prove but again has gaseous distention. No nausea or vomiting. Tolerating diet.    Objective: Filed Vitals:   08/22/15 0622 08/22/15 0956  BP: 149/64 131/69  Pulse: 88 84  Temp: 98.5 F (36.9 C) 97.9 F (36.6 C)  Resp: 16 15    Intake/Output Summary (Last 24 hours) at 08/22/15 1321 Last data filed at 08/21/15 1700  Gross per 24 hour  Intake    240 ml  Output      0 ml  Net    240 ml   Filed Weights   08/15/15 1200  Weight: 67.8 kg (149 lb 7.6 oz)    Exam:   General:  Elderly female In no acute distress  HEENT: Moist mucosa  Chest: Clear Bilaterally  CVS: Normal S1 murmurs  GI: Lower abdomen distended, nontender, bowel sounds hyperactive  Musculoskeletal: , no edema,Clean dressing over her low back, nontender  CNS: Alert and oriented  Data Reviewed: Basic Metabolic Panel:  Recent Labs Lab 08/19/15 1405 08/21/15 0400  NA 138 137  K 4.1 3.6  CL 98* 104  CO2 33* 25  GLUCOSE 185* 93  BUN 9 5*  CREATININE 0.92 0.78  CALCIUM 8.8* 8.7*   Liver Function Tests:  Recent Labs Lab 08/19/15 1405  AST 27  ALT 27  ALKPHOS 46  BILITOT 0.6  PROT 6.0*  ALBUMIN 2.7*   No results for input(s): LIPASE, AMYLASE in the last 168 hours. No  results for input(s): AMMONIA in the last 168 hours. CBC:  Recent Labs Lab 08/19/15 1405  WBC 9.8  NEUTROABS 7.2  HGB 11.9*  HCT 34.8*  MCV 92.8  PLT 283   Cardiac Enzymes: No results for input(s): CKTOTAL, CKMB, CKMBINDEX, TROPONINI in the last 168  hours. BNP (last 3 results) No results for input(s): BNP in the last 8760 hours.  ProBNP (last 3 results) No results for input(s): PROBNP in the last 8760 hours.  CBG:  Recent Labs Lab 08/21/15 1149 08/21/15 1643 08/21/15 2102 08/22/15 0605 08/22/15 1132  GLUCAP 101* 79 206* 186* 230*    Recent Results (from the past 240 hour(s))  C difficile quick scan w PCR reflex     Status: None   Collection Time: 08/19/15 10:57 PM  Result Value Ref Range Status   C Diff antigen NEGATIVE NEGATIVE Final   C Diff toxin NEGATIVE NEGATIVE Final   C Diff interpretation Negative for toxigenic C. difficile  Final  H.pylori Antigen, Stool     Status: None   Collection Time: 08/19/15 10:57 PM  Result Value Ref Range Status   Specimen Description PER RECTUM  Final   Special Requests NONE  Final   H. pylori ag, stool   Final    NOT DETECTED Antimicrobials,proton pump inhibitors and bismuth preparations are known to suppress H.pylori and ingestion of these prior to testing may cause a false negative result. If a negative result is obtained for a patient that has  ingested these  compounds within two weeks prior of performing the H.pylori test, results may be falsely negative and should be repeated with a new specimen two weeks after discontinuing treatment. Performed at [facility]    Report Status 08/20/2015 FINAL  Final     Studies: Ct Abdomen Pelvis W Contrast  08/21/2015  CLINICAL DATA:  Nausea, vomiting, and abdominal pain. Recent lumbar laminectomy. EXAM: CT ABDOMEN AND PELVIS WITH CONTRAST TECHNIQUE: Multidetector CT imaging of the abdomen and pelvis was performed using the standard protocol following bolus administration of intravenous contrast. CONTRAST:  121m OMNIPAQUE IOHEXOL 300 MG/ML  SOLN COMPARISON:  No prior CT. FINDINGS: Lower chest: 2.2 cm subpleural nodular opacity in the right lower lobe with questionable surrounding spiculation. Linear atelectasis in the right middle  lobe. Coronary artery calcifications. Liver: No focal lesion. Hepatobiliary: Gallbladder decompressed.  No calcified stone. Pancreas: No ductal dilatation or inflammation. Atrophic parenchyma. Spleen: Normal. Adrenal glands: 2.4 x 1.6 cm left adrenal nodule. Mild right adrenal thickening. Kidneys: Symmetric renal enhancement and excretion. There is prominence of both proximal renal collecting systems without ureteral dilatation. 2.9 cm are left renal cyst. Stomach/Bowel: Stomach is decompressed. Proximal small bowel loops are normal. Mild gaseous distention of pelvic and distal small bowel loops. Gaseous distention of cecum and ascending colon, cecum measuring up to 10 mm. No mesenteric twisting or volvulus. Liquid stool throughout the colon with distention of the ascending, transverse, and proximal descending colon. Liquid and solid stool in the sigmoid colon, with distal colonic diverticulosis. Portions of the sigmoid colon are obscured by streak artifact from hip prosthesis. No colonic wall thickening. Vascular/Lymphatic: No retroperitoneal adenopathy. Abdominal aorta is normal in caliber. Moderate atherosclerosis without aneurysm. Reproductive: Uterus likely remains in situ, detailed evaluation obscured by streak artifact. Bladder: Distended but obscured. Other: Patient is post L2 through L5 posterior fusion. There is an irregular air in fluid collection within the left psoas muscle adjacent to the surgical hardware. This measures 5.3 x 3.7 x 8.4  cm. Air in fluid collection in the surgical bed posteriorly, with scattered air tracking about the subcutaneous tissues of the left flank. Minimal air tracks anterior to the left iliopsoas muscle and retroperitoneum. No intraperitoneal fluid collection. Musculoskeletal: Posterior fusion L2 through L5 with interbody spacers. No bony destructive change. Air and fluid in the surgical bed posteriorly without rim enhancement. Bilateral hip arthroplasties. IMPRESSION: 1. Post  recent lumbar spine surgery with irregular air and fluid collection in the left psoas muscle, 5.3 x 3.7 x 8.4 cm, concerning for postoperative abscess. Air tracks along the iliopsoas muscle in the left retroperitoneum, as well as subcutaneous tissues. Ill-defined air and fluid in the surgical bed posteriorly is likely sequela of recent surgery without rim enhancement of the subcutaneous fluid. 2. Distended fluid-filled colon, most significantly affecting the ascending colon and cecum. Findings suggest colonic ileus. No evidence of volvulus. Mild small bowel dilatation distally. 3. Right lower lobe 2.2 cm subpleural nodular opacity, partially included in the field of view, however concerning for pulmonary nodule/malignancy, primary bronchogenic versus metastatic. Recommend chest CT for characterization. There is a 2.4 cm left adrenal nodule, nonspecific, however concerning for metastasis in this setting. No prior exams available for comparison. These results will be called to the ordering clinician or representative by the Radiologist Assistant, and communication documented in the PACS or zVision Dashboard. Electronically Signed   By: Jeb Levering M.D.   On: 08/21/2015 03:04    Scheduled Meds: . acidophilus   Oral Daily  . dicyclomine  20 mg Oral TID AC  . docusate sodium  100 mg Oral BID  . Glycerin (Adult)  1 suppository Rectal Daily  . insulin aspart  0-5 Units Subcutaneous QHS  . insulin aspart  0-9 Units Subcutaneous TID WC  . lisinopril  10 mg Oral Daily  . loratadine  10 mg Oral Daily  . mesalamine  750 mg Oral Daily  . multivitamin with minerals  1 tablet Oral Daily  . pantoprazole (PROTONIX) IV  40 mg Intravenous Q12H  . polyethylene glycol  17 g Oral Daily  . sodium chloride flush  3 mL Intravenous Q12H  . sucralfate  1 g Oral TID WC & HS  . tiZANidine  1 mg Oral QHS  . vitamin B-12  3,000 mcg Oral Daily  . vitamin C  1,000 mg Oral Daily  . Vitamin D (Ergocalciferol)  50,000 Units  Oral Q14 Days   Continuous Infusions: . sodium chloride 250 mL (08/13/15 1746)  . dextrose 5 % and 0.45 % NaCl with KCl 20 mEq/L 100 mL/hr at 08/19/15 1933      Time spent: [place], [facility] Pager [phone] If 7PM-7AM, please contact night-coverage at [email or web address], password [facility] 08/22/2015, 1:21 PM  LOS: 9 days

## 2015-08-23 ENCOUNTER — Inpatient Hospital Stay (HOSPITAL_COMMUNITY): Payer: Medicare Other

## 2015-08-23 LAB — BASIC METABOLIC PANEL
Anion gap: 11 (ref 5–15)
BUN: 10 mg/dL (ref 6–20)
CO2: 26 mmol/L (ref 22–32)
CREATININE: 0.89 mg/dL (ref 0.44–1.00)
Calcium: 8.7 mg/dL — ABNORMAL LOW (ref 8.9–10.3)
Chloride: 100 mmol/L — ABNORMAL LOW (ref 101–111)
GFR calc Af Amer: >60 mL/min (ref >60)
Glucose, Bld: 180 mg/dL — ABNORMAL HIGH (ref 65–99)
POTASSIUM: 4.1 mmol/L (ref 3.5–5.1)
SODIUM: 137 mmol/L (ref 135–145)

## 2015-08-23 LAB — NOROVIRUS GROUP 1 & 2 BY PCR, STOOL
NOROVIRUS 1 BY PCR: NEGATIVE
Norovirus 2  by PCR: NEGATIVE

## 2015-08-23 LAB — GLUCOSE, CAPILLARY
GLUCOSE-CAPILLARY: 157 mg/dL — AB (ref 65–99)
GLUCOSE-CAPILLARY: 305 mg/dL — AB (ref 65–99)
Glucose-Capillary: 238 mg/dL — ABNORMAL HIGH (ref 65–99)
Glucose-Capillary: 257 mg/dL — ABNORMAL HIGH (ref 65–99)

## 2015-08-23 MED ORDER — INSULIN GLARGINE 100 UNIT/ML ~~LOC~~ SOLN
25.0000 [IU] | Freq: Every day | SUBCUTANEOUS | Status: DC
Start: 2015-08-23 — End: 2015-08-26
  Administered 2015-08-23 – 2015-08-25 (×3): 25 [IU] via SUBCUTANEOUS
  Filled 2015-08-23 (×6): qty 0.25

## 2015-08-23 MED ORDER — DIPHENHYDRAMINE HCL 25 MG PO CAPS
25.0000 mg | ORAL_CAPSULE | Freq: Once | ORAL | Status: AC
  Administered 2015-08-23: 25 mg via ORAL
  Filled 2015-08-23: qty 1

## 2015-08-23 MED ORDER — GLYCERIN (LAXATIVE) 2.1 G RE SUPP
1.0000 | RECTAL | Status: DC | PRN
  Filled 2015-08-23: qty 1

## 2015-08-23 NOTE — Progress Notes (Signed)
Inserted rectal tube and left it in for 1 hour; patient passed no gas and fecal matter collected.  Patient stated she did not feel any relief from the procedure.

## 2015-08-23 NOTE — Progress Notes (Signed)
[facility] Progress Note  Subjective:  Abd film this morning:IMPRESSION: 1. Nonobstructive bowel gas pattern. No evidence of obstruction or ileus on today's examination. 2. No pneumoperitoneum. 3. Previously identified gas and fluid collection in the left psoas muscle is obscured by bowel gas on today's examination.  Says she had rectal tube for 45 minutes yesterday. Passed some gas, but then tube was removed.  Still feels distended. No N/V.    Objective:  Vital signs in last 24 hours: Temp:  [98.1 F (36.7 C)-98.7 F (37.1 C)] 98.1 F (36.7 C) (03/12 1004) Pulse Rate:  [79-107] 79 (03/12 1004) Resp:  [15-19] 16 (03/12 1004) BP: (135-151)/(57-66) 136/57 mmHg (03/12 1004) SpO2:  [95 %-98 %] 97 % (03/12 1004) Last BM Date: 08/22/15 General:   Alert,  Well-developed,    in NAD Heart:  Regular rate and rhythm; no murmurs Pulm;lungs clear Abdomen:  Soft, distended, hypoactive BS   Extremities:  Without edema. Neurologic:  Alert and  oriented x4;  grossly normal neurologically. Psych:  Alert and cooperative. Normal mood and affect.   Recent Labs  08/21/15 0400 08/23/15 0557  NA 137 137  K 3.6 4.1  CL 104 100*  CO2 25 26  GLUCOSE 93 180*  BUN 5* 10  CREATININE 0.78 0.89  CALCIUM 8.7* 8.7*    Dg Abd 2 Views  08/23/2015  CLINICAL DATA:  75 year old female with recent back surgery.  Ileus. EXAM: ABDOMEN - 2 VIEW COMPARISON:  Abdominal radiograph 08/22/2015. 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 Gas and stool are seen scattered throughout the colon extending to the level of the distal rectum. No pathologic distension of small bowel is noted. No gross evidence of pneumoperitoneum. IMPRESSION: 1. Nonobstructive bowel gas pattern. No evidence of obstruction or ileus on today's examination. 2. No pneumoperitoneum. 3. Previously identified gas and fluid collection in the  left psoas muscle is obscured by bowel gas on today's examination. Electronically Signed   By: Vinnie Langton M.D.   On: 08/23/2015 10:08   Dg Abd 2 Views  08/22/2015  CLINICAL DATA:  Abdominal distension EXAM: ABDOMEN - 2 VIEW COMPARISON:  08/21/2015 CT abdomen/pelvis FINDINGS: There is mild diffuse gaseous distention of the colon, decreased in the interval, with particularly decreased distention of the cecum. No evidence of pneumatosis or pneumoperitoneum. No disproportionately dilated small bowel loops. Limited visualization of retroperitoneal gas in the left paraspinal soft tissues at the L2-3 level. Status post bilateral posterior spinal fusion from L2-L5, with no evidence of hardware fracture or loosening. Visualized lung bases appear clear. IMPRESSION: 1. Mild colonic ileus, significantly improved. 2. No evidence of small-bowel obstruction. 3. Limited visualization of known retroperitoneal gas in the left psoas, as better seen on CT study from 1 day prior. Electronically Signed   By: Ilona Sorrel M.D.   On: 08/22/2015 18:00    ASSESSMENT/PLAN:  75 yo female s/p spinal surgery, now with colonic ileus post op vs [place]. Has hx UC as well.Has had some improvement after colonic decompression, but feels she is again getting distended. Films today with no evidence of obstruction or ileus. Contnie daily glycerin suppository and miralax. Minimize pain meds. Change position frequently--avoid lying on back for long periods.  Ambulate.   LOS: 10 days   Hvozdovic, Vita Barley PA-C 08/23/2015, Pager [phone] Mon-Fri 8a-5p [phone] after 5p, weekends, holidays     GI Attending   I have taken an interval  history, reviewed the chart and examined the patient. I agree with the Advanced Practitioner's note, impression and recommendations.   Post-op colonic ileus UC  "I had a blowout after glycerin suppository and I want another. I do feel better"  She is improved. Dr. Collene Mares or Benson Norway to f/u  tomorrow  Gatha Mayer, MD, [facility] [phone] (pager) [phone] after 5 PM, weekends and holidays  08/23/2015 2:51 PM

## 2015-08-23 NOTE — Progress Notes (Signed)
[facility] PROGRESS NOTE  Sheri Reid TTS:[id number] DOB: 04-27-1941 DOA: 08/13/2015 PCP: Talmage Coin, MD  Brief narrative 75 year old female with lumbar spondylolisthesis, insulin-dependent diabetes mellitus who was admitted for elective decompressive lumbar laminectomy (left axis L2-L5 anterior lateral interbody fusion with percutaneous pedicle screws on 08/13/2015). On 08/19/2015 hospitalist was consulted for episodes of intermittent hypoglycemia with ongoing nausea, vomiting,  and abdominal pain. Patient did not have any bowel movement since 08/13/2015. She had an NG tube placed for about 2 days for decompression that was removed. She had loose bowel movement on 3/6 following laxatives. Subsequently she developed colonic ileus. Hospitalist consulted for further management.  Assessment/Plan: Hypoglycemia Suspected due to poor by mouth intake with ongoing nausea and vomiting and  getting full dose of Lantus. Lantus dose reduced and CBG stable. Metformin and Victoza have been discontinued.   Abdominal distention secondary to colonic obstruction  CT abdomen done showed significant colonic distention suggestive of ogilivie's home.  Underwent colonoscopy with decompression on 08/21/2015. Showed significant dilatation the ascending colon and into cecum -Symptoms appears to slowly improving. Still has distention but follow-up abdominal x-ray shows no ileus today. Keep k>4. Discontinued Vicodin.  -Appreciate GI follow-up.  Recommend avoiding narcotics. Daily glycerin suppositories and MiraLAX. Recommend frequent position changes and ambulation. serial abdominal exam.    ? Left psoas muscle abscess As commented on CT. Images reviewed by neurosurgery and recommend this is more likely postoperative seroma versus hematoma. Clinical signs of infection.  Lumbar spinal surgery Per primary team.   Right lower lobe pulmonary nodule  CT abdomen shows 2.2 cm subpleural nodular opacity in the right  lower lobe. Has smoking history.. follow-up with CT chest with contrast tomorrow if abdominal distention better. Also has 2.4 1.6 cm left adrenal nodule.  History of ulcerative colitis On mesalamine. Follows with Dr. Collene Mares  Essential Hypertension Continue home medications  Diet: Regular  Code Status: Full code Family Communication: Son at bedside Disposition Plan: Inpatient monitoring her now until GI issues resolve.    Procedures:  Abdominal CT  Colonoscopy with decompression of colonic distention.  Antibiotics:  None  HPI/Subjective: See and examined. Still has abdominal distention but not in pain. Passing gas but has not had bowel movement.    Objective: Filed Vitals:   08/23/15 0500 08/23/15 1004  BP: 144/64 136/57  Pulse: 89 79  Temp: 98.4 F (36.9 C) 98.1 F (36.7 C)  Resp: 18 16    Intake/Output Summary (Last 24 hours) at 08/23/15 1125 Last data filed at 08/23/15 0000  Gross per 24 hour  Intake    240 ml  Output      0 ml  Net    240 ml   Filed Weights   08/15/15 1200  Weight: 67.8 kg (149 lb 7.6 oz)    Exam:   General:  no acute distress  HEENT: Moist mucosa  Chest: Clear Bilaterally  CVS: Normal S1 murmurs  GI: Lower abdomen distended, nontender, bowel sounds hyperactive  Musculoskeletal: , no edema,Clean dressing over her low back, nontender  CNS: Alert and oriented  Data Reviewed: Basic Metabolic Panel:  Recent Labs Lab 08/19/15 1405 08/21/15 0400 08/23/15 0557  NA 138 137 137  K 4.1 3.6 4.1  CL 98* 104 100*  CO2 33* 25 26  GLUCOSE 185* 93 180*  BUN 9 5* 10  CREATININE 0.92 0.78 0.89  CALCIUM 8.8* 8.7* 8.7*   Liver Function Tests:  Recent Labs Lab 08/19/15 1405  AST 27  ALT 27  ALKPHOS 46  BILITOT 0.6  PROT 6.0*  ALBUMIN 2.7*   No results for input(s): LIPASE, AMYLASE in the last 168 hours. No results for input(s): AMMONIA in the last 168 hours. CBC:  Recent Labs Lab 08/19/15 1405  WBC 9.8   NEUTROABS 7.2  HGB 11.9*  HCT 34.8*  MCV 92.8  PLT 283   Cardiac Enzymes: No results for input(s): CKTOTAL, CKMB, CKMBINDEX, TROPONINI in the last 168 hours. BNP (last 3 results) No results for input(s): BNP in the last 8760 hours.  ProBNP (last 3 results) No results for input(s): PROBNP in the last 8760 hours.  CBG:  Recent Labs Lab 08/21/15 2102 08/22/15 0605 08/22/15 1132 08/22/15 1634 08/23/15 0638  GLUCAP 206* 186* 230* 242* 157*    Recent Results (from the past 240 hour(s))  C difficile quick scan w PCR reflex     Status: None   Collection Time: 08/19/15 10:57 PM  Result Value Ref Range Status   C Diff antigen NEGATIVE NEGATIVE Final   C Diff toxin NEGATIVE NEGATIVE Final   C Diff interpretation Negative for toxigenic C. difficile  Final  H.pylori Antigen, Stool     Status: None   Collection Time: 08/19/15 10:57 PM  Result Value Ref Range Status   Specimen Description PER RECTUM  Final   Special Requests NONE  Final   H. pylori ag, stool   Final    NOT DETECTED Antimicrobials,proton pump inhibitors and bismuth preparations are known to suppress H.pylori and ingestion of these prior to testing may cause a false negative result. If a negative result is obtained for a patient that has  ingested these  compounds within two weeks prior of performing the H.pylori test, results may be falsely negative and should be repeated with a new specimen two weeks after discontinuing treatment. Performed at [facility]    Report Status 08/20/2015 FINAL  Final     Studies: Dg Abd 2 Views  08/23/2015  CLINICAL DATA:  75 year old female with recent back surgery.  Ileus. EXAM: ABDOMEN - 2 VIEW COMPARISON:  Abdominal radiograph 08/22/2015. 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 Gas and stool are seen scattered throughout the colon extending to the level of the distal rectum. No  pathologic distension of small bowel is noted. No gross evidence of pneumoperitoneum. IMPRESSION: 1. Nonobstructive bowel gas pattern. No evidence of obstruction or ileus on today's examination. 2. No pneumoperitoneum. 3. Previously identified gas and fluid collection in the left psoas muscle is obscured by bowel gas on today's examination. Electronically Signed   By: Vinnie Langton M.D.   On: 08/23/2015 10:08   Dg Abd 2 Views  08/22/2015  CLINICAL DATA:  Abdominal distension EXAM: ABDOMEN - 2 VIEW COMPARISON:  08/21/2015 CT abdomen/pelvis FINDINGS: There is mild diffuse gaseous distention of the colon, decreased in the interval, with particularly decreased distention of the cecum. No evidence of pneumatosis or pneumoperitoneum. No disproportionately dilated small bowel loops. Limited visualization of retroperitoneal gas in the left paraspinal soft tissues at the L2-3 level. Status post bilateral posterior spinal fusion from L2-L5, with no evidence of hardware fracture or loosening. Visualized lung bases appear clear. IMPRESSION: 1. Mild colonic ileus, significantly improved. 2. No evidence of small-bowel obstruction. 3. Limited visualization of known retroperitoneal gas in the left psoas, as better seen on CT study from 1 day prior. Electronically Signed   By: Ilona Sorrel M.D.   On:  08/22/2015 18:00    Scheduled Meds: . acidophilus   Oral Daily  . dicyclomine  20 mg Oral TID AC  . docusate sodium  100 mg Oral BID  . Glycerin (Adult)  1 suppository Rectal Daily  . insulin aspart  0-5 Units Subcutaneous QHS  . insulin aspart  0-9 Units Subcutaneous TID WC  . insulin glargine  22 Units Subcutaneous QHS  . lisinopril  10 mg Oral Daily  . loratadine  10 mg Oral Daily  . mesalamine  750 mg Oral Daily  . multivitamin with minerals  1 tablet Oral Daily  . pantoprazole (PROTONIX) IV  40 mg Intravenous Q12H  . polyethylene glycol  17 g Oral Daily  . sodium chloride flush  3 mL Intravenous Q12H  .  sucralfate  1 g Oral TID WC & HS  . tiZANidine  1 mg Oral QHS  . vitamin B-12  3,000 mcg Oral Daily  . vitamin C  1,000 mg Oral Daily  . Vitamin D (Ergocalciferol)  50,000 Units Oral Q14 Days   Continuous Infusions: . sodium chloride 250 mL (08/13/15 1746)      Time spent: [place], [place]  [facility] Pager [phone] If 7PM-7AM, please contact night-coverage at [email or web address], password [facility] 08/23/2015, 11:25 AM  LOS: 10 days

## 2015-08-23 NOTE — Progress Notes (Signed)
Patient ID: Sheri Reid, female   DOB: 10/17/1940, 75 y.o.   MRN: [id number] Patient is doing okay still no spontaneous bowel movements pain seems well managed  Strength 5 out of 5 wound is clean dry and intact  Continue to work on ileus. physical and occupational therapy

## 2015-08-24 ENCOUNTER — Inpatient Hospital Stay (HOSPITAL_COMMUNITY): Payer: Medicare Other

## 2015-08-24 LAB — GLUCOSE, CAPILLARY
GLUCOSE-CAPILLARY: 180 mg/dL — AB (ref 65–99)
GLUCOSE-CAPILLARY: 320 mg/dL — AB (ref 65–99)
Glucose-Capillary: 190 mg/dL — ABNORMAL HIGH (ref 65–99)
Glucose-Capillary: 261 mg/dL — ABNORMAL HIGH (ref 65–99)

## 2015-08-24 MED ORDER — IOHEXOL 300 MG/ML  SOLN
75.0000 mL | Freq: Once | INTRAMUSCULAR | Status: AC | PRN
  Administered 2015-08-24: 75 mL via INTRAVENOUS

## 2015-08-24 MED ORDER — HYDROCOD POLST-CPM POLST ER 10-8 MG/5ML PO SUER
5.0000 mL | Freq: Once | ORAL | Status: AC
  Administered 2015-08-24: 5 mL via ORAL
  Filled 2015-08-24: qty 5

## 2015-08-24 MED ORDER — INSULIN ASPART 100 UNIT/ML ~~LOC~~ SOLN
5.0000 [IU] | Freq: Three times a day (TID) | SUBCUTANEOUS | Status: DC
  Administered 2015-08-24 – 2015-08-26 (×6): 5 [IU] via SUBCUTANEOUS

## 2015-08-24 NOTE — Progress Notes (Addendum)
Occupational Therapy Treatment Patient Details Name: Sheri Reid MRN: [id number] DOB: 01-29-1941 Today's Date: 08/24/2015    History of present illness Pt is a 75 y.o. female who presents s/p L2-L5 anterior lateral lumbar fusion on 08/14/15. developed ileus with colonoscopy 3/10 and relief     OT comments  Pt progressing. Education provided in session. Will continue to follow acutely.  Follow Up Recommendations  Home health OT;Supervision - Intermittent    Equipment Recommendations  3 in 1 bedside comode;Tub/shower seat;Other (comment) (AE)    Recommendations for Other Services      Precautions / Restrictions Precautions Precautions: Fall;Back Precaution Booklet Issued: No Precaution Comments: reviewed back precautions; pt able to state 2/3 on her own Required Braces or Orthoses: Spinal Brace Spinal Brace: Lumbar corset;Applied in sitting position Restrictions Weight Bearing Restrictions: No       Mobility Bed Mobility Overal bed mobility: Needs Assistance Bed Mobility: Rolling;Sidelying to Sit Rolling: Supervision Sidelying to sit: Supervision       General bed mobility comments: cues for technique.  Transfers Overall transfer level: Needs assistance Equipment used: Rolling walker (2 wheeled) Transfers: Sit to/from Stand Sit to Stand/Stand to Sit: Min assist         General transfer comment: assist to stand from bed and for stand to sit transfer to toilet.    Balance      Min guard for ambulation and for balance when pt was standing during functional task in bathroom.                              ADL Overall ADL's : Needs assistance/impaired     Grooming: Oral care;Wash/dry face;Set up;Supervision/safety;Standing;Wash/dry hands           Upper Body Dressing : Minimal assistance;Sitting;Standing Upper Body Dressing Details (indicate cue type and reason): back brace     Toilet Transfer: Minimal assistance;Ambulation;RW (used toilet in  bathroom and grab bar) Toilet Transfer Details (indicate cue type and reason): Min assist for sit to stand from bed Toileting- Clothing Manipulation and Hygiene: Minimal assistance;Sit to/from stand       Functional mobility during ADLs: Min guard;Rolling walker General ADL Comments: Educated on AE and pt practiced with reacher and sock aid. Discussed incorporating precautions into functional activities. Educated on what pt could use for toilet aid. Cues for precautions in session. Discussed back brace wear (clothing underneath).      Vision                     Perception     Praxis      Cognition  Awake/alert Behavior During Therapy: WFL for tasks assessed/performed Overall Cognitive Status: No family/caregiver present to determine baseline cognitive functioning (also oriented her to day)       Memory: Decreased recall of precautions;Decreased short-term memory               Extremity/Trunk Assessment               Exercises     Shoulder Instructions       General Comments      Pertinent Vitals/ Pain       Pain Assessment: 0-10 Pain Score: 4  Pain Location: back Pain Intervention(s): Monitored during session;Repositioned  Home Living  Prior Functioning/Environment              Frequency Min 2X/week     Progress Toward Goals  OT Goals(current goals can now be found in the care plan section)  Progress towards OT goals: Progressing toward goals  Acute Rehab OT Goals Patient Stated Goal: not stated OT Goal Formulation: With patient/family Time For Goal Achievement: 08/28/15 Potential to Achieve Goals: Good ADL Goals Pt Will Perform Grooming: with modified independence;standing Pt Will Perform Lower Body Bathing: with modified independence;with adaptive equipment;sit to/from stand Pt Will Perform Lower Body Dressing: with modified independence;with adaptive equipment;sit  to/from stand Pt Will Transfer to Toilet: ambulating;bedside commode;with modified independence (over toilet) Pt Will Perform Toileting - Clothing Manipulation and hygiene: with modified independence;with adaptive equipment;sit to/from stand Additional ADL Goal #1: Pt will independently don/doff back brace for increased independence and safety with ADLs and functional mobiltiy. Additional ADL Goal #2: Pt will independently verbally recall 3/3 back precautions and maintain throughout ADL activity.  Plan Discharge plan remains appropriate    Co-evaluation                 End of Session Equipment Utilized During Treatment: Gait belt;Rolling walker;Back brace   Activity Tolerance Patient tolerated treatment well   Patient Left in chair;with call bell/phone within reach;with chair alarm set   Nurse Communication          Time: 6045-4098 OT Time Calculation (min): 18 min  Charges: OT General Charges $OT Visit: 1 Procedure OT Treatments $Self Care/Home Management : 8-22 mins  Benito Mccreedy OTR/L [phone] 08/24/2015, 12:14 PM

## 2015-08-24 NOTE — Progress Notes (Signed)
Subjective: Patient seems to be doing somewhat better today with regards to her post-op ileus. She had a small volume BM earlier today. Denies having any abdominal pain but has a lot of bloating and distention.   Objective: Vital signs in last 24 hours: Temp:  [98 F (36.7 C)-99 F (37.2 C)] 98.9 F (37.2 C) (03/13 1730) Pulse Rate:  [81-92] 91 (03/13 1730) Resp:  [18] 18 (03/13 1730) BP: (114-156)/(43-67) 126/63 mmHg (03/13 1730) SpO2:  [95 %-100 %] 100 % (03/13 1730) Last BM Date: 08/24/15  General appearance: alert, cooperative, appears stated age, fatigued and no distress Resp: clear to auscultation bilaterally Cardio: regular rate and rhythm, S1, S2 normal, no murmur, click, rub or gallop GI: soft, obese, slightly distended, non-tender; bowel sounds normal; no masses,  no organomegaly Extremities: extremities normal, atraumatic, no cyanosis or edema  Lab Results: No results for input(s): WBC, HGB, HCT, PLT in the last 72 hours. BMET  Recent Labs  08/23/15 0557  NA 137  K 4.1  CL 100*  CO2 26  GLUCOSE 180*  BUN 10  CREATININE 0.89  CALCIUM 8.7*   Studies/Results: Ct Chest W Contrast  08/24/2015  CLINICAL DATA:  Significant back pain, pulmonary nodule on recent CTA abdomen EXAM: CT CHEST WITH CONTRAST TECHNIQUE: Multidetector CT imaging of the chest was performed during intravenous contrast administration. CONTRAST:  87m OMNIPAQUE IOHEXOL 300 MG/ML  SOLN COMPARISON:  08/21/2015 FINDINGS: The lungs are well aerated bilaterally. There is again noted a somewhat ovoid shaped subpleural density which measures approximately 2.3 cm in greatest dimension. The variation in size is related to the lack of complete coverage on the prior exam. No other focal nodular density is seen. The thoracic inlet is within normal limits. Calcifications of the thoracic aorta are noted without dissection. Limited evaluation of the pulmonary artery shows no large central pulmonary embolus. No  significant hilar or mediastinal adenopathy is noted. Mild Coronary calcifications are seen. Visualized upper abdomen again demonstrates a lesion within the left adrenal gland better evaluated on prior exam. The osseous structures are within normal limits. Some air is noted along the left chest wall similar to that seen on prior CT examination and likely related to the recent surgery. IMPRESSION: 2.3 cm subpleural density identified. There is some spiculation identified. No lymphadenopathy seen. Although this may be post infectious in etiology or possibly related to pulmonary infarct, the possibility of neoplasm deserves consideration. PET-CT may be helpful for further characterization. Alternatively a short-term followup could be performed. Stable changes in the left adrenal gland Electronically Signed   By: MInez CatalinaM.D.   On: 08/24/2015 11:01   Dg Abd 2 Views  08/23/2015  CLINICAL DATA:  75year old female with recent back surgery.  Ileus. EXAM: ABDOMEN - 2 VIEW COMPARISON:  Abdominal radiograph 08/22/2015. 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 Gas and stool are seen scattered throughout the colon extending to the level of the distal rectum. No pathologic distension of small bowel is noted. No gross evidence of pneumoperitoneum. IMPRESSION: 1. Nonobstructive bowel gas pattern. No evidence of obstruction or ileus on today's examination. 2. No pneumoperitoneum. 3. Previously identified gas and fluid collection in the left psoas muscle is obscured by bowel gas on today's examination. Electronically Signed   By: DVinnie LangtonM.D.   On: 08/23/2015 10:08   Medications: I have reviewed the patient's current medications.  Assessment/Plan: 1) Post-op ileus improving with glycerin suppositories  and Miralax. Patient encouraged to ambulate and minimize the use of narcotics. Check electrolytes and correct as  needed. 2) Ulcerative colitis on ??Pentasa. 3) Left psoas seroma.   LOS: 11 days   Julious Langlois 08/24/2015, 8:42 PM

## 2015-08-24 NOTE — Progress Notes (Addendum)
[facility] PROGRESS NOTE  Sheri Reid CBJ:[id number] DOB: 1940-07-31 DOA: 08/13/2015 PCP: Talmage Coin, MD  Brief narrative 75 year old female with lumbar spondylolisthesis, insulin-dependent diabetes mellitus who was admitted for elective decompressive lumbar laminectomy (left axis L2-L5 anterior lateral interbody fusion with percutaneous pedicle screws on 08/13/2015). On 08/19/2015 hospitalist was consulted for episodes of intermittent hypoglycemia with ongoing nausea, vomiting,  and abdominal pain. Patient did not have any bowel movement since 08/13/2015. She had an NG tube placed for about 2 days for decompression that was removed. She had loose bowel movement on 3/6 following laxatives. Subsequently she developed colonic ileus. Hospitalist consulted for further management.  Assessment/Plan: Hypoglycemia Suspected due to poor by mouth intake with ongoing nausea and vomiting and  getting full dose of Lantus. Lantus dose reduced and CBG stable. Metformin and Victoza have been discontinued. A1c of 7.7.   Abdominal distention secondary to colonic obstruction  CT abdomen done showed significant colonic distention suggestive of ogilivie's home.  Underwent colonoscopy with decompression on 08/21/2015. Showed significant dilatation the ascending colon and into cecum -Symptoms appears to slowly improving. Still has distention but follow-up abdominal x-ray shows no ileus today. Keep k>4. Discontinued Vicodin.  -Appreciate GI follow-up.  Recommend avoiding narcotics. Symptoms better with Daily glycerin suppositories and MiraLAX. Recommend frequent position changes and ambulation. serial abdominal exam.    ? Left psoas muscle abscess As commented on CT. Images reviewed by neurosurgery and recommend this is more likely postoperative seroma versus hematoma. Clinical signs of infection.  Lumbar spinal surgery Per primary team.   Right lower lobe pulmonary nodule  CT abdomen shows 2.2 cm  subpleural nodular opacity in the right lower lobe. Has smoking history..  Also has 2.4 1.6 cm left adrenal nodule. A CT of her chest with contrast done today shows 2.3 cm subpleural density with differential including postinfectious versus? Pulmonary infarct and malignancy. Patient does not have any clinical signs or symptoms to suggest PE.  Also given her long-term smoking history malignancy should be considered. Recommend PET-CT to further characterize or short-term follow-up with CT chest. I think a follow-up chest CT in 3 months would be reasonable.   History of ulcerative colitis On mesalamine. Follows with Dr. Collene Mares  Essential Hypertension Continue home medications  Diet: Regular  Code Status: Full code Family Communication: None at bedside Disposition Plan: PT recommends home health. Should be stable once GI issue completely resolved.    Procedures:  Abdominal CT  Colonoscopy with decompression of colonic distention.  CT chest  Antibiotics:  None  HPI/Subjective: See and examined. Still has abdominal distention but much improved.    Objective: Filed Vitals:   08/24/15 1033 08/24/15 1329  BP: 137/60 114/43  Pulse: 83 88  Temp: 99 F (37.2 C) 98.4 F (36.9 C)  Resp: 18 18   No intake or output data in the 24 hours ending 08/24/15 1358 Filed Weights   08/15/15 1200  Weight: 67.8 kg (149 lb 7.6 oz)    Exam:   General:  no acute distress  HEENT: Moist mucosa  Chest: Clear Bilaterally  CVS: Normal S1 and s2  GI: Lower abdomen distended(Improved), nontender, bowel sounds hyperactive  Musculoskeletal: , no edema,Clean dressing over her low back,    Data Reviewed: Basic Metabolic Panel:  Recent Labs Lab 08/19/15 1405 08/21/15 0400 08/23/15 0557  NA 138 137 137  K 4.1 3.6 4.1  CL 98* 104 100*  CO2 33* 25 26  GLUCOSE 185* 93 180*  BUN 9 5* 10  CREATININE 0.92 0.78 0.89  CALCIUM 8.8* 8.7* 8.7*   Liver Function Tests:  Recent Labs Lab  08/19/15 1405  AST 27  ALT 27  ALKPHOS 46  BILITOT 0.6  PROT 6.0*  ALBUMIN 2.7*   No results for input(s): LIPASE, AMYLASE in the last 168 hours. No results for input(s): AMMONIA in the last 168 hours. CBC:  Recent Labs Lab 08/19/15 1405  WBC 9.8  NEUTROABS 7.2  HGB 11.9*  HCT 34.8*  MCV 92.8  PLT 283   Cardiac Enzymes: No results for input(s): CKTOTAL, CKMB, CKMBINDEX, TROPONINI in the last 168 hours. BNP (last 3 results) No results for input(s): BNP in the last 8760 hours.  ProBNP (last 3 results) No results for input(s): PROBNP in the last 8760 hours.  CBG:  Recent Labs Lab 08/23/15 1123 08/23/15 1604 08/23/15 2224 08/24/15 0703 08/24/15 1204  GLUCAP 238* 257* 305* 190* 261*    Recent Results (from the past 240 hour(s))  C difficile quick scan w PCR reflex     Status: None   Collection Time: 08/19/15 10:57 PM  Result Value Ref Range Status   C Diff antigen NEGATIVE NEGATIVE Final   C Diff toxin NEGATIVE NEGATIVE Final   C Diff interpretation Negative for toxigenic C. difficile  Final  H.pylori Antigen, Stool     Status: None   Collection Time: 08/19/15 10:57 PM  Result Value Ref Range Status   Specimen Description PER RECTUM  Final   Special Requests NONE  Final   H. pylori ag, stool   Final    NOT DETECTED Antimicrobials,proton pump inhibitors and bismuth preparations are known to suppress H.pylori and ingestion of these prior to testing may cause a false negative result. If a negative result is obtained for a patient that has  ingested these  compounds within two weeks prior of performing the H.pylori test, results may be falsely negative and should be repeated with a new specimen two weeks after discontinuing treatment. Performed at [facility]    Report Status 08/20/2015 FINAL  Final     Studies: Ct Chest W Contrast  08/24/2015  CLINICAL DATA:  Significant back pain, pulmonary nodule on recent CTA abdomen EXAM: CT CHEST WITH CONTRAST  TECHNIQUE: Multidetector CT imaging of the chest was performed during intravenous contrast administration. CONTRAST:  80m OMNIPAQUE IOHEXOL 300 MG/ML  SOLN COMPARISON:  08/21/2015 FINDINGS: The lungs are well aerated bilaterally. There is again noted a somewhat ovoid shaped subpleural density which measures approximately 2.3 cm in greatest dimension. The variation in size is related to the lack of complete coverage on the prior exam. No other focal nodular density is seen. The thoracic inlet is within normal limits. Calcifications of the thoracic aorta are noted without dissection. Limited evaluation of the pulmonary artery shows no large central pulmonary embolus. No significant hilar or mediastinal adenopathy is noted. Mild Coronary calcifications are seen. Visualized upper abdomen again demonstrates a lesion within the left adrenal gland better evaluated on prior exam. The osseous structures are within normal limits. Some air is noted along the left chest wall similar to that seen on prior CT examination and likely related to the recent surgery. IMPRESSION: 2.3 cm subpleural density identified. There is some spiculation identified. No lymphadenopathy seen. Although this may be post infectious in etiology or possibly related to pulmonary infarct, the possibility of neoplasm deserves consideration. PET-CT may be helpful for further characterization. Alternatively a short-term followup could be performed. Stable changes in the left adrenal  gland Electronically Signed   By: Inez Catalina M.D.   On: 08/24/2015 11:01   Dg Abd 2 Views  08/23/2015  CLINICAL DATA:  75 year old female with recent back surgery.  Ileus. EXAM: ABDOMEN - 2 VIEW COMPARISON:  Abdominal radiograph 08/22/2015. 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 Gas and stool are seen scattered throughout the colon extending to the level of the distal rectum. No  pathologic distension of small bowel is noted. No gross evidence of pneumoperitoneum. IMPRESSION: 1. Nonobstructive bowel gas pattern. No evidence of obstruction or ileus on today's examination. 2. No pneumoperitoneum. 3. Previously identified gas and fluid collection in the left psoas muscle is obscured by bowel gas on today's examination. Electronically Signed   By: Vinnie Langton M.D.   On: 08/23/2015 10:08   Dg Abd 2 Views  08/22/2015  CLINICAL DATA:  Abdominal distension EXAM: ABDOMEN - 2 VIEW COMPARISON:  08/21/2015 CT abdomen/pelvis FINDINGS: There is mild diffuse gaseous distention of the colon, decreased in the interval, with particularly decreased distention of the cecum. No evidence of pneumatosis or pneumoperitoneum. No disproportionately dilated small bowel loops. Limited visualization of retroperitoneal gas in the left paraspinal soft tissues at the L2-3 level. Status post bilateral posterior spinal fusion from L2-L5, with no evidence of hardware fracture or loosening. Visualized lung bases appear clear. IMPRESSION: 1. Mild colonic ileus, significantly improved. 2. No evidence of small-bowel obstruction. 3. Limited visualization of known retroperitoneal gas in the left psoas, as better seen on CT study from 1 day prior. Electronically Signed   By: Ilona Sorrel M.D.   On: 08/22/2015 18:00    Scheduled Meds: . acidophilus   Oral Daily  . dicyclomine  20 mg Oral TID AC  . docusate sodium  100 mg Oral BID  . Glycerin (Adult)  1 suppository Rectal Daily  . insulin aspart  0-5 Units Subcutaneous QHS  . insulin aspart  0-9 Units Subcutaneous TID WC  . insulin glargine  25 Units Subcutaneous QHS  . lisinopril  10 mg Oral Daily  . loratadine  10 mg Oral Daily  . mesalamine  750 mg Oral Daily  . multivitamin with minerals  1 tablet Oral Daily  . pantoprazole (PROTONIX) IV  40 mg Intravenous Q12H  . polyethylene glycol  17 g Oral Daily  . sodium chloride flush  3 mL Intravenous Q12H  .  sucralfate  1 g Oral TID WC & HS  . tiZANidine  1 mg Oral QHS  . vitamin B-12  3,000 mcg Oral Daily  . vitamin C  1,000 mg Oral Daily  . Vitamin D (Ergocalciferol)  50,000 Units Oral Q14 Days   Continuous Infusions: . sodium chloride 250 mL (08/13/15 1746)      Time spent: [place], [place]  [facility] Pager [phone] If 7PM-7AM, please contact night-coverage at [email or web address], password [facility] 08/24/2015, 1:58 PM  LOS: 11 days

## 2015-08-24 NOTE — Progress Notes (Signed)
Physical Therapy Treatment Patient Details Name: Sheri Reid MRN: [id number] DOB: [birth year] Today's Date: 08/24/2015    History of Present Illness Pt is a 75 y.o. female who presents s/p L2-L5 anterior lateral lumbar fusion on 08/14/15. developed ileus with colonoscopy 3/10 and relief      PT Comments    Making good progress towards functional goals. Greatly improved tolerance with gait up to 150 feet, lightly using a rolling walker for support. Guarded but stable with this device. Feels she is getting much stronger. Will continue to follow and progress until d/c.   Follow Up Recommendations  Home health PT;Supervision for mobility/OOB     Equipment Recommendations  Rolling walker with 5" wheels;Hospital bed    Recommendations for Other Services       Precautions / Restrictions Precautions Precautions: Fall;Back Precaution Booklet Issued: No Precaution Comments: reviewed back precautions Required Braces or Orthoses: Spinal Brace Spinal Brace: Lumbar corset;Applied in sitting position Restrictions Weight Bearing Restrictions: No    Mobility  Bed Mobility Overal bed mobility: Needs Assistance Bed Mobility: Rolling;Sidelying to Sit Rolling: Supervision Sidelying to sit: Supervision       General bed mobility comments: In recliner  Transfers Overall transfer level: Needs assistance Equipment used: Rolling walker (2 wheeled) Transfers: Sit to/from Stand Sit to Stand: Min guard         General transfer comment: Min guard for safety. Good power up to stand. VC for hand placement.  Ambulation/Gait Ambulation/Gait assistance: Supervision Ambulation Distance (Feet): 150 Feet Assistive device: Rolling walker (2 wheeled) Gait Pattern/deviations: Step-through pattern;Decreased stride length;Shuffle Gait velocity: Decreased Gait velocity interpretation: Below normal speed for age/gender General Gait Details: Intermittently shuffling feet. VC for larger stride length  and foot clearance. No overt loss of balance. Focused on improving speed and decreasing support through UEs on walker. no buckling but guarded. Able to step backwards safely with cues for foot clearance.   Stairs            Wheelchair Mobility    Modified Rankin (Stroke Patients Only)       Balance                                    Cognition Arousal/Alertness: Awake/alert Behavior During Therapy: WFL for tasks assessed/performed Overall Cognitive Status: No family/caregiver present to determine baseline cognitive functioning       Memory: Decreased recall of precautions;Decreased short-term memory              Exercises General Exercises - Lower Extremity Ankle Circles/Pumps: AROM;Both;10 reps;Seated Long Arc Quad: Strengthening;Both;10 reps;Seated    General Comments General comments (skin integrity, edema, etc.): Feels she is getting stronger      Pertinent Vitals/Pain Pain Assessment: 0-10 Pain Score: 4  Pain Location: back Pain Descriptors / Indicators: Operative site guarding Pain Intervention(s): Monitored during session;Repositioned    Home Living                      Prior Function            PT Goals (current goals can now be found in the care plan section) Acute Rehab PT Goals Patient Stated Goal: not stated PT Goal Formulation: With patient Time For Goal Achievement: 08/29/15 Potential to Achieve Goals: Good Progress towards PT goals: Progressing toward goals    Frequency  Min 5X/week    PT Plan Current plan remains appropriate  Co-evaluation             End of Session Equipment Utilized During Treatment: Gait belt;Back brace Activity Tolerance: Patient tolerated treatment well Patient left: in chair;with call bell/phone within reach;with chair alarm set     Time: 8727-6184 PT Time Calculation (min) (ACUTE ONLY): 15 min  Charges:  $Gait Training: 8-22 mins                    G Codes:       Ellouise Newer 09-23-2015, 12:39 PM  Camille Bal [place], [place]

## 2015-08-24 NOTE — Care Management Note (Signed)
Case Management Note  Patient Details  Name: MARISHA RENIER MRN: [id number] Date of Birth: July 07, 1940  Subjective/Objective:                    Action/Plan: PT/OT continue to recommend HHPT/OT. CM following for d/c needs.   Expected Discharge Date:                  Expected Discharge Plan:     In-House Referral:     Discharge planning Services     Post Acute Care Choice:    Choice offered to:     DME Arranged:    DME Agency:     HH Arranged:    HH Agency:     Status of Service:  In process, will continue to follow  Medicare Important Message Given:  Yes Date Medicare IM Given:    Medicare IM give by:    Date Additional Medicare IM Given:    Additional Medicare Important Message give by:     If discussed at [place] of Stay Meetings, dates discussed:    Additional Comments:  Pollie Friar, RN 08/24/2015, 11:25 AM

## 2015-08-24 NOTE — Care Management Important Message (Signed)
Important Message  Patient Details  Name: Sheri Reid MRN: [id number] Date of Birth: 11-Nov-1940   Medicare Important Message Given:  Other (see comment) (not given, per ncm)    Deeana [place] P Niklaus Mamaril 08/24/2015, 12:21 PM

## 2015-08-24 NOTE — Progress Notes (Signed)
Inpatient Diabetes Program Recommendations  AACE/ADA: New Consensus Statement on Inpatient Glycemic Control (2015)  Target Ranges:  Prepandial:   less than 140 mg/dL      Peak postprandial:   less than 180 mg/dL (1-2 hours)      Critically ill patients:  140 - 180 mg/dL   Results for AUDRENA, TALAGA (MRN [id number]) as of 08/24/2015 11:10  Ref. Range 08/23/2015 06:38 08/23/2015 11:23 08/23/2015 16:04 08/23/2015 22:24 08/24/2015 07:03  Glucose-Capillary Latest Ref Range: 65-99 mg/dL 157 (H) 238 (H) 257 (H) 305 (H) 190 (H)   Review of Glycemic Control  Diabetes history: DM 2 Outpatient Diabetes medications: Lantus 44 units QHS, Metformin 1,000 mg BID, Novolog 1-2 units Daily PRN, Victoza 1.8 Daily Current orders for Inpatient glycemic control: Lantus 25 units QHS, Novolog Sensitive + HS scale  Inpatient Diabetes Program Recommendations:  Insulin - Meal Coverage: Glucose increased into the 200-300 range after meals. Please consider starting Novolog 5 units TID meal coverage in addition to correction scale if patient is eating at least 50 % of meals.  Thanks,  Tama Headings RN, MSN, [facility] Inpatient Diabetes Coordinator Team Pager [phone] (8a-5p)

## 2015-08-24 NOTE — Progress Notes (Signed)
Subjective: Patient reports "This was more than I signed up for"(laughing).  "I don't hurt now, just feels different"  Objective: Vital signs in last 24 hours: Temp:  [98 F (36.7 C)-99 F (37.2 C)] 98.4 F (36.9 C) (03/13 1329) Pulse Rate:  [81-92] 88 (03/13 1329) Resp:  [18] 18 (03/13 1329) BP: (114-156)/(43-67) 114/43 mmHg (03/13 1329) SpO2:  [95 %-100 %] 99 % (03/13 1329)  Intake/Output from previous day:   Intake/Output this shift:    Opens eyes to voice. Conversant, laughing, reporting great improvement over last few days. MAEW with good strength. Incisions with honeycomb drsgs over Dermabond. Sites without erythema, swelling, or drainage. Belly soft at present, nontender, "It feels different, doesn't hurt".  Pt considering SNF [facility] in [place] until she feels strong enough to shower on her own (as she lives alone).   Lab Results: No results for input(s): WBC, HGB, HCT, PLT in the last 72 hours. BMET  Recent Labs  08/23/15 0557  NA 137  K 4.1  CL 100*  CO2 26  GLUCOSE 180*  BUN 10  CREATININE 0.89  CALCIUM 8.7*    Studies/Results: Ct Chest W Contrast  08/24/2015  CLINICAL DATA:  Significant back pain, pulmonary nodule on recent CTA abdomen EXAM: CT CHEST WITH CONTRAST TECHNIQUE: Multidetector CT imaging of the chest was performed during intravenous contrast administration. CONTRAST:  20m OMNIPAQUE IOHEXOL 300 MG/ML  SOLN COMPARISON:  08/21/2015 FINDINGS: The lungs are well aerated bilaterally. There is again noted a somewhat ovoid shaped subpleural density which measures approximately 2.3 cm in greatest dimension. The variation in size is related to the lack of complete coverage on the prior exam. No other focal nodular density is seen. The thoracic inlet is within normal limits. Calcifications of the thoracic aorta are noted without dissection. Limited evaluation of the pulmonary artery shows no large central pulmonary embolus. No significant hilar or  mediastinal adenopathy is noted. Mild Coronary calcifications are seen. Visualized upper abdomen again demonstrates a lesion within the left adrenal gland better evaluated on prior exam. The osseous structures are within normal limits. Some air is noted along the left chest wall similar to that seen on prior CT examination and likely related to the recent surgery. IMPRESSION: 2.3 cm subpleural density identified. There is some spiculation identified. No lymphadenopathy seen. Although this may be post infectious in etiology or possibly related to pulmonary infarct, the possibility of neoplasm deserves consideration. PET-CT may be helpful for further characterization. Alternatively a short-term followup could be performed. Stable changes in the left adrenal gland Electronically Signed   By: MInez CatalinaM.D.   On: 08/24/2015 11:01   Dg Abd 2 Views  08/23/2015  CLINICAL DATA:  75year old female with recent back surgery.  Ileus. EXAM: ABDOMEN - 2 VIEW COMPARISON:  Abdominal radiograph 08/22/2015. 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 Gas and stool are seen scattered throughout the colon extending to the level of the distal rectum. No pathologic distension of small bowel is noted. No gross evidence of pneumoperitoneum. IMPRESSION: 1. Nonobstructive bowel gas pattern. No evidence of obstruction or ileus on today's examination. 2. No pneumoperitoneum. 3. Previously identified gas and fluid collection in the left psoas muscle is obscured by bowel gas on today's examination. Electronically Signed   By: DVinnie LangtonM.D.   On: 08/23/2015 10:08   Dg Abd 2 Views  08/22/2015  CLINICAL DATA:  Abdominal distension EXAM: ABDOMEN - 2  VIEW COMPARISON:  08/21/2015 CT abdomen/pelvis FINDINGS: There is mild diffuse gaseous distention of the colon, decreased in the interval, with particularly decreased distention of the cecum. No  evidence of pneumatosis or pneumoperitoneum. No disproportionately dilated small bowel loops. Limited visualization of retroperitoneal gas in the left paraspinal soft tissues at the L2-3 level. Status post bilateral posterior spinal fusion from L2-L5, with no evidence of hardware fracture or loosening. Visualized lung bases appear clear. IMPRESSION: 1. Mild colonic ileus, significantly improved. 2. No evidence of small-bowel obstruction. 3. Limited visualization of known retroperitoneal gas in the left psoas, as better seen on CT study from 1 day prior. Electronically Signed   By: Ilona Sorrel M.D.   On: 08/22/2015 18:00    Assessment/Plan: Improving   LOS: 11 days  Mobilizing with PT. Daily suppositories continue. Spoke with Dr. Claretta Fraise, Hospitalist, hopeful of d/c next 1-2 days if she continues to feel well. Appreciate Dr Jorge Mandril and Dr Celesta Aver assistance.    Verdis Prime 08/24/2015, 2:52 PM

## 2015-08-25 LAB — BASIC METABOLIC PANEL
Anion gap: 11 (ref 5–15)
BUN: 16 mg/dL (ref 6–20)
CO2: 24 mmol/L (ref 22–32)
CREATININE: 0.84 mg/dL (ref 0.44–1.00)
Calcium: 9.4 mg/dL (ref 8.9–10.3)
Chloride: 103 mmol/L (ref 101–111)
Glucose, Bld: 87 mg/dL (ref 65–99)
POTASSIUM: 3.7 mmol/L (ref 3.5–5.1)
SODIUM: 138 mmol/L (ref 135–145)

## 2015-08-25 LAB — GLUCOSE, CAPILLARY
GLUCOSE-CAPILLARY: 169 mg/dL — AB (ref 65–99)
Glucose-Capillary: 182 mg/dL — ABNORMAL HIGH (ref 65–99)
Glucose-Capillary: 218 mg/dL — ABNORMAL HIGH (ref 65–99)
Glucose-Capillary: 164 mg/dL — ABNORMAL HIGH (ref 65–99)

## 2015-08-25 MED ORDER — GABAPENTIN 100 MG PO CAPS
100.0000 mg | ORAL_CAPSULE | Freq: Two times a day (BID) | ORAL | Status: DC
  Administered 2015-08-25 – 2015-08-26 (×2): 100 mg via ORAL
  Filled 2015-08-25 (×3): qty 1

## 2015-08-25 NOTE — Progress Notes (Signed)
[facility] PROGRESS NOTE  Sheri Reid BCW:[id number] DOB: February 15, 1941 DOA: 08/13/2015 PCP: Talmage Coin, MD  Brief narrative 75 year old female with lumbar spondylolisthesis, insulin-dependent diabetes mellitus who was admitted for elective decompressive lumbar laminectomy (left axis L2-L5 anterior lateral interbody fusion with percutaneous pedicle screws on 08/13/2015). On 08/19/2015 hospitalist was consulted for episodes of intermittent hypoglycemia with ongoing nausea, vomiting,  and abdominal pain. Patient did not have any bowel movement since 08/13/2015. She had an NG tube placed for about 2 days for decompression that was removed. She had loose bowel movement on 3/6 following laxatives. Subsequently she developed colonic ileus. Hospitalist consulted for further management.   Assessment/Plan: Hypoglycemia Suspected due to poor by mouth intake with ongoing nausea and vomiting and  getting full dose of Lantus. Lantus dose reduced. . Metformin and Victoza held. A1c of 7.7. CBG 160-320 past 24 hrs. Will titrate lantus dose.   Abdominal distention secondary to colonic obstruction  CT abdomen done showed significant colonic distention suggestive of ogilivie's home.  Underwent colonoscopy with decompression on 08/21/2015. Showed significant dilatation the ascending colon and into cecum -Symptoms appears to slowly improving. Keep k>4. Discontinued Vicodin.  -Appreciate GI follow-up.  Recommend avoiding narcotics. Symptoms improving daily with Daily glycerin suppositories and MiraLAX. Recommend frequent position changes and ambulation. serial abdominal exam. -Discussed with Dr. Collene Mares. Patient still has abdominal distention although improving. Recommends to monitor for another day and if continues to improve can be discharged home with outpatient follow-up with her early next week. (Recommends to have patient call her upon discharge to schedule appointment)   ? Left psoas muscle fluid  collection CT of the abdomen done showed possible abscess but she has no clinical signs or symptoms. Neurosurgeon also think this is likely postoperative changes/seroma  Lumbar spinal surgery On low-dose when necessary Dilaudid. Did not require pain medication last few days but today complaining of low back pain. Neurosurgery following and have set up outpatient appointment with Dr Vertell Limber.   Right lower lobe pulmonary nodule  CT abdomen shows 2.2 cm subpleural nodular opacity in the right lower lobe. Has smoking history..  Also has 2.4 1.6 cm left adrenal nodule. A CT of her chest with contrast done today shows 2.3 cm subpleural density with differential including postinfectious versus? Pulmonary infarct and malignancy. Patient does not have any clinical signs or symptoms to suggest PE.  Also given her long-term smoking history malignancy should be considered. Recommend PET-CT to further characterize or short-term follow-up with CT chest. I think a follow-up chest CT in 3 months would be reasonable.   History of ulcerative colitis Follows with Dr. Collene Mares. Home medication showed that patient was on mesalamine which was continued during hospitalization. Dr. Collene Mares recommends to discontinue it as it has not been helping her with her symptoms.  Essential Hypertension Continue home medications  Diet: Regular (as per patient's request)  Code Status: Full code Family Communication: None at bedside Disposition Plan: PT recommends home health. can discharged home on 3/15 if abdominal distention further improves.    Procedures:  Abdominal CT  Colonoscopy with decompression of colonic distention.  CT chest  Antibiotics:  None  HPI/Subjective: See and examined. Complains of low back pain today. Still has abdominal distention but improved from previous days.    Objective: Filed Vitals:   08/25/15 0948 08/25/15 1410  BP: 111/56 120/46  Pulse: 102 104  Temp: 99.1 F (37.3 C) 98.2 F  (36.8 C)  Resp: 16 18   No intake or output data  in the 24 hours ending 08/25/15 1540 Filed Weights   08/15/15 1200  Weight: 67.8 kg (149 lb 7.6 oz)    Exam:   General:  no acute distress  HEENT: Moist mucosa  Chest: Clear Bilaterally  CVS: Normal S1 and s2  GI: Lower abdomen distended(Improved), nontender, bowel sounds hyperactive  Musculoskeletal: , no edema,Clean dressing over her low back,    Data Reviewed: Basic Metabolic Panel:  Recent Labs Lab 08/19/15 1405 08/21/15 0400 08/23/15 0557 08/25/15 0820  NA 138 137 137 138  K 4.1 3.6 4.1 3.7  CL 98* 104 100* 103  CO2 33* 25 26 24   GLUCOSE 185* 93 180* 87  BUN 9 5* 10 16  CREATININE 0.92 0.78 0.89 0.84  CALCIUM 8.8* 8.7* 8.7* 9.4   Liver Function Tests:  Recent Labs Lab 08/19/15 1405  AST 27  ALT 27  ALKPHOS 46  BILITOT 0.6  PROT 6.0*  ALBUMIN 2.7*   No results for input(s): LIPASE, AMYLASE in the last 168 hours. No results for input(s): AMMONIA in the last 168 hours. CBC:  Recent Labs Lab 08/19/15 1405  WBC 9.8  NEUTROABS 7.2  HGB 11.9*  HCT 34.8*  MCV 92.8  PLT 283   Cardiac Enzymes: No results for input(s): CKTOTAL, CKMB, CKMBINDEX, TROPONINI in the last 168 hours. BNP (last 3 results) No results for input(s): BNP in the last 8760 hours.  ProBNP (last 3 results) No results for input(s): PROBNP in the last 8760 hours.  CBG:  Recent Labs Lab 08/24/15 1204 08/24/15 1625 08/24/15 2101 08/25/15 0633 08/25/15 1148  GLUCAP 261* 180* 320* 182* 169*    Recent Results (from the past 240 hour(s))  C difficile quick scan w PCR reflex     Status: None   Collection Time: 08/19/15 10:57 PM  Result Value Ref Range Status   C Diff antigen NEGATIVE NEGATIVE Final   C Diff toxin NEGATIVE NEGATIVE Final   C Diff interpretation Negative for toxigenic C. difficile  Final  H.pylori Antigen, Stool     Status: None   Collection Time: 08/19/15 10:57 PM  Result Value Ref Range Status    Specimen Description PER RECTUM  Final   Special Requests NONE  Final   H. pylori ag, stool   Final    NOT DETECTED Antimicrobials,proton pump inhibitors and bismuth preparations are known to suppress H.pylori and ingestion of these prior to testing may cause a false negative result. If a negative result is obtained for a patient that has  ingested these  compounds within two weeks prior of performing the H.pylori test, results may be falsely negative and should be repeated with a new specimen two weeks after discontinuing treatment. Performed at [facility]    Report Status 08/20/2015 FINAL  Final     Studies: Ct Chest W Contrast  08/24/2015  CLINICAL DATA:  Significant back pain, pulmonary nodule on recent CTA abdomen EXAM: CT CHEST WITH CONTRAST TECHNIQUE: Multidetector CT imaging of the chest was performed during intravenous contrast administration. CONTRAST:  25m OMNIPAQUE IOHEXOL 300 MG/ML  SOLN COMPARISON:  08/21/2015 FINDINGS: The lungs are well aerated bilaterally. There is again noted a somewhat ovoid shaped subpleural density which measures approximately 2.3 cm in greatest dimension. The variation in size is related to the lack of complete coverage on the prior exam. No other focal nodular density is seen. The thoracic inlet is within normal limits. Calcifications of the thoracic aorta are noted without dissection. Limited evaluation of the  pulmonary artery shows no large central pulmonary embolus. No significant hilar or mediastinal adenopathy is noted. Mild Coronary calcifications are seen. Visualized upper abdomen again demonstrates a lesion within the left adrenal gland better evaluated on prior exam. The osseous structures are within normal limits. Some air is noted along the left chest wall similar to that seen on prior CT examination and likely related to the recent surgery. IMPRESSION: 2.3 cm subpleural density identified. There is some spiculation identified. No  lymphadenopathy seen. Although this may be post infectious in etiology or possibly related to pulmonary infarct, the possibility of neoplasm deserves consideration. PET-CT may be helpful for further characterization. Alternatively a short-term followup could be performed. Stable changes in the left adrenal gland Electronically Signed   By: Inez Catalina M.D.   On: 08/24/2015 11:01    Scheduled Meds: . acidophilus   Oral Daily  . dicyclomine  20 mg Oral TID AC  . docusate sodium  100 mg Oral BID  . Glycerin (Adult)  1 suppository Rectal Daily  . insulin aspart  0-5 Units Subcutaneous QHS  . insulin aspart  0-9 Units Subcutaneous TID WC  . insulin aspart  5 Units Subcutaneous TID WC  . insulin glargine  25 Units Subcutaneous QHS  . lisinopril  10 mg Oral Daily  . loratadine  10 mg Oral Daily  . multivitamin with minerals  1 tablet Oral Daily  . pantoprazole (PROTONIX) IV  40 mg Intravenous Q12H  . polyethylene glycol  17 g Oral Daily  . sodium chloride flush  3 mL Intravenous Q12H  . sucralfate  1 g Oral TID WC & HS  . tiZANidine  1 mg Oral QHS  . vitamin B-12  3,000 mcg Oral Daily  . vitamin C  1,000 mg Oral Daily  . Vitamin D (Ergocalciferol)  50,000 Units Oral Q14 Days   Continuous Infusions: . sodium chloride 250 mL (08/13/15 1746)      Time spent: [place], [place]  [facility] Pager [phone] If 7PM-7AM, please contact night-coverage at [email or web address], password [facility] 08/25/2015, 3:40 PM  LOS: 12 days

## 2015-08-25 NOTE — Progress Notes (Signed)
Subjective: Patient reports "When I hurt, it's on this left side (lumbar). My stomach is getting better"  Objective: Vital signs in last 24 hours: Temp:  [98.2 F (36.8 C)-99.1 F (37.3 C)] 99.1 F (37.3 C) (03/14 0948) Pulse Rate:  [86-102] 102 (03/14 0948) Resp:  [16-18] 16 (03/14 0948) BP: (111-145)/(43-66) 111/56 mmHg (03/14 0948) SpO2:  [95 %-100 %] 95 % (03/14 0948)  Intake/Output from previous day:   Intake/Output this shift:    Alert, conversant. Reports difficult night after spilling ice pack in bed. MAEW. good strength BLE. Incisions with honeycomb/Dermabond - removed. Sites wiithout erythema, swelling, or drainage. Pt hopeful of d/c to SNF for rehab. (states son has to attend a funeral today)  Lab Results: No results for input(s): WBC, HGB, HCT, PLT in the last 72 hours. BMET  Recent Labs  08/23/15 0557 08/25/15 0820  NA 137 138  K 4.1 3.7  CL 100* 103  CO2 26 24  GLUCOSE 180* 87  BUN 10 16  CREATININE 0.89 0.84  CALCIUM 8.7* 9.4    Studies/Results: Ct Chest W Contrast  08/24/2015  CLINICAL DATA:  Significant back pain, pulmonary nodule on recent CTA abdomen EXAM: CT CHEST WITH CONTRAST TECHNIQUE: Multidetector CT imaging of the chest was performed during intravenous contrast administration. CONTRAST:  57m OMNIPAQUE IOHEXOL 300 MG/ML  SOLN COMPARISON:  08/21/2015 FINDINGS: The lungs are well aerated bilaterally. There is again noted a somewhat ovoid shaped subpleural density which measures approximately 2.3 cm in greatest dimension. The variation in size is related to the lack of complete coverage on the prior exam. No other focal nodular density is seen. The thoracic inlet is within normal limits. Calcifications of the thoracic aorta are noted without dissection. Limited evaluation of the pulmonary artery shows no large central pulmonary embolus. No significant hilar or mediastinal adenopathy is noted. Mild Coronary calcifications are seen. Visualized upper  abdomen again demonstrates a lesion within the left adrenal gland better evaluated on prior exam. The osseous structures are within normal limits. Some air is noted along the left chest wall similar to that seen on prior CT examination and likely related to the recent surgery. IMPRESSION: 2.3 cm subpleural density identified. There is some spiculation identified. No lymphadenopathy seen. Although this may be post infectious in etiology or possibly related to pulmonary infarct, the possibility of neoplasm deserves consideration. PET-CT may be helpful for further characterization. Alternatively a short-term followup could be performed. Stable changes in the left adrenal gland Electronically Signed   By: MInez CatalinaM.D.   On: 08/24/2015 11:01    Assessment/Plan: Improving   LOS: 12 days  Expecting d/c today or tomorrow. Pt verbalizes understanding of d/c instructions from NS standpoint. Drsgs removed. She has f/u appt with Dr. SVertell Limberon the 27th.    PVerdis Prime3/14/2017, 12:00 PM

## 2015-08-25 NOTE — Care Management Important Message (Signed)
Important Message  Patient Details  Name: Sheri Reid MRN: [id number] Date of Birth: 30-Oct-1940   Medicare Important Message Given:  Yes    [name] [name] 08/25/2015, 12:18 PM

## 2015-08-25 NOTE — Progress Notes (Signed)
Physical Therapy Treatment Patient Details Name: Sheri Reid MRN: [id number] DOB: [birth year] Today's Date: 08/25/2015    History of Present Illness Pt is a 75 y.o. female who presents s/p L2-L5 anterior lateral lumbar fusion on 08/14/15. developed ileus with colonoscopy 3/10 and relief      PT Comments    Progressing with independence with transfers this session and more confident with standing activities without UE support.  Feel continued skilled PT in the acute setting will allow safe d/c home with family support.  Follow Up Recommendations  Home health PT;Supervision for mobility/OOB     Equipment Recommendations  Rolling walker with 5" wheels;Hospital bed    Recommendations for Other Services       Precautions / Restrictions Precautions Precautions: Fall;Back Precaution Comments: reviewed back precautions during function Required Braces or Orthoses: Spinal Brace Spinal Brace: Lumbar corset;Applied in sitting position    Mobility  Bed Mobility               General bed mobility comments: up in chair  Transfers Overall transfer level: Needs assistance Equipment used: Rolling walker (2 wheeled) Transfers: Sit to/from Stand Sit to Stand: Supervision         General transfer comment: to and from 3:1 over toilet and to recliner; min cues for safety  Ambulation/Gait Ambulation/Gait assistance: Min guard;Supervision Ambulation Distance (Feet): 140 Feet Assistive device: Rolling walker (2 wheeled) Gait Pattern/deviations: Step-through pattern;Shuffle;Decreased stride length     General Gait Details: cues for stride length with pt able to correct, but degrades into shorter shuffling pattern over time; noted right hip higher, but her friend relates pt more upright since surgery   Stairs            Wheelchair Mobility    Modified Rankin (Stroke Patients Only)       Balance     Sitting balance-Leahy Scale: Good     Standing balance support: No  upper extremity supported;During functional activity Standing balance-Leahy Scale: Fair Standing balance comment: stood to wash hands and brush teeth at sink with min cues for using cup to spit to avoid forward bending                    Cognition Arousal/Alertness: Awake/alert Behavior During Therapy: WFL for tasks assessed/performed Overall Cognitive Status: No family/caregiver present to determine baseline cognitive functioning       Memory: Decreased short-term memory              Exercises      General Comments        Pertinent Vitals/Pain Pain Score: 6  Pain Location: back Pain Descriptors / Indicators: Operative site guarding Pain Intervention(s): Limited activity within patient's tolerance;Monitored during session;Repositioned    Home Living                      Prior Function            PT Goals (current goals can now be found in the care plan section) Progress towards PT goals: Progressing toward goals    Frequency       PT Plan      Co-evaluation             End of Session Equipment Utilized During Treatment: Back brace Activity Tolerance: Patient tolerated treatment well Patient left: in chair;with call bell/phone within reach;with family/visitor present;with chair alarm set     Time: 2449-7530 PT Time Calculation (min) (ACUTE ONLY): 18 min  Charges:  $Gait Training: 8-22 mins                    G Codes:      Reginia Naas 09-22-15, 1:41 PM  [place], [place] September 22, 2015

## 2015-08-25 NOTE — Discharge Instructions (Signed)
Spinal Fusion, Care After Refer to this sheet in the next few weeks. These instructions provide you with information on caring for yourself after your procedure. Your caregiver may also give you more specific instructions. Your treatment has been planned according to current medical practices, but problems sometimes occur. Call your caregiver if you have any problems or questions after your procedure. HOME CARE INSTRUCTIONS   Take whatever pain medicine has been prescribed by your caregiver. Do not take over-the-counter pain medicine unless directed otherwise by your caregiver.  Do not drive if you are taking narcotic pain medicines.  Change your bandage (dressing) if necessary or as directed by your caregiver.  Do not get your surgical cut (incision) wet. After a few days you may take quick showers (rather than baths), but keep your incision clean and dry. Covering the incision with plastic wrap while you shower should keep your incision dry. A few weeks after surgery, once your incision has healed and your caregiver says it is okay, you can take baths or go swimming.  If you have been prescribed medicine to prevent your blood from clotting, follow the directions carefully.  Check the area around your incision often. Look for redness and swelling. Also, look for anything leaking from your wound. You can use a mirror or have a family member inspect your incision if it is in a place where it is difficult for you to see.  Ask your caregiver what activities you should avoid and for how long.  Walk as much as possible.  Do not lift anything heavier than 10 pounds (4.5 kilograms) until your caregiver says it is safe.  Do not twist or bend for a few weeks. Try not to pull on things. Avoid sitting for long periods of time. Change positions at least every hour.  Ask your caregiver what kinds of exercise you should do to make your back stronger and when you should begin doing these exercises. SEEK  IMMEDIATE MEDICAL CARE IF:   Pain suddenly becomes much worse.  The incision area is red, swollen, bleeding, or leaking fluid.  Your legs or feet become increasingly painful, numb, weak, or swollen.  You have trouble controlling urination or bowel movements.  You have trouble breathing.  You have chest pain.  You have a fever. MAKE SURE YOU:  Understand these instructions.  Will watch your condition.  Will get help right away if you are not doing well or get worse.   This information is not intended to replace advice given to you by your health care provider. Make sure you discuss any questions you have with your health care provider.   Document Released: 12/17/2004 Document Revised: 06/20/2014 Document Reviewed: 11/12/2014 Elsevier Interactive Patient Education [employer].  Ileus  Ileus is a condition in which the intestines, also called the bowels, stop working and moving correctly. If the intestines stop working, food cannot pass through to get digested. The intestines are hollow organs that digest food after the food leaves the stomach. These organs are long, muscular tubes that connect the stomach to the rectum. When ileus occurs, the muscular contractions that cause food to move through the intestines stop happening as they normally would. Ileus can occur for various reasons. This condition is a serious problem that usually requires hospitalization. It can cause symptoms such as nausea, abdominal pain, and bloating. Ileus can last from a few hours to a few days. If the intestines stop working because of a blockage, that is a different  condition that is called a bowel obstruction. CAUSES This condition may be caused by:  Surgery on the abdomen.  An infection or inflammation in the abdomen. This includes inflammation of the lining of the abdomen (peritonitis).  Infection or inflammation in other parts of the body, such as pneumonia or pancreatitis.  Passage of  gallstones or kidney stones.  Damage to the nerves or blood vessels that go to the intestines.  A collection of blood within the abdominal cavity.  Imbalance in the salts in the blood (electrolytes).  Injury to the brain or spinal cord.  Medicines. Many medicines, including strong pain medicines, can cause ileus or make it worse. SYMPTOMS Symptoms of this condition include:  Bloating of the abdomen.  Pain or discomfort in the abdomen.  Poor appetite.  Nausea and vomiting.  Lack of normal bowel sounds, such as "growling" in the stomach. DIAGNOSIS This condition may be diagnosed with:  A physical exam and medical history.  X-rays or a CT scan of the abdomen. You may also have other tests to help find the cause of the condition. TREATMENT Treatment for this condition may include:  Resting the intestines until they start to work again. This is often done by:  Stopping oral intake of food and drink. You will be given fluid through an IV tube to prevent dehydration.  Placing a small tube (nasogastric tube or NG tube) that is passed through your nose and into your stomach. The tube is attached to a suction device and keeps the stomach emptied out. This allows the bowels to rest and also helps to reduce nausea and vomiting.  Correcting any electrolyte imbalance by giving supplements in the IV fluid.  Stopping any medicines that might make ileus worse.  Treating any condition that may have caused ileus. HOME CARE INSTRUCTIONS  Follow instructions from your health care provider about diet and fluid intake. Usually, you will be told to:  Drink plenty of clear fluids.  Avoid alcohol.  Avoid caffeine.  Eat a bland diet.  Get plenty of rest. Return to your normal activities as told by your health care provider.  Take over-the-counter and prescription medicines only as told by your health care provider.  Keep all follow-up visits as told by your health care provider. This  is important. SEEK MEDICAL CARE IF:  You have nausea, vomiting, or abdominal discomfort.  You have a fever. SEEK IMMEDIATE MEDICAL CARE IF:  You have severe abdominal pain or bloating.  You cannot eat or drink without vomiting.   This information is not intended to replace advice given to you by your health care provider. Make sure you discuss any questions you have with your health care provider.   Document Released: 06/02/2003 Document Revised: 02/18/2015 Document Reviewed: 07/24/2014 Elsevier Interactive Patient Education 2016 Else[employer]  Preventing Constipation After Surgery Constipation is when a person has fewer than 3 bowel movements a week; has difficulty having a bowel movement; or has stools that are dry, hard, or larger than normal. Many things can make constipation likely after surgery. They include:  Medicines, especially numbing medicines (anesthetics) and very strong pain medicines called narcotics.  Feeling stressed because of the surgery.  Eating different foods than normal.  Being less active. Symptoms of constipation include:  Having fewer than 3 bowel movements a week.  Straining to have a bowel movement.  Having hard, dry, or larger-than-normal stools.  Feeling full or bloated.  Having pain in the lower abdomen.  Not feeling relief after  having a bowel movement. HOME CARE INSTRUCTIONS  Diet  Eat foods that have a lot of fiber. These include fruits, vegetables, whole grains, and beans. Limit foods high in fat and processed sugars. These include french fries, hamburgers, cookies, and candy.  Take a fiber supplement as directed. If you are not taking a fiber supplement and think that you are not getting enough fiber from foods, talk to your health care provider about adding a fiber supplement to your diet.  Drink clear fluids, especially water. Avoid drinking alcohol, caffeine, and soda. These can make constipation worse.  Drink enough fluids to  keep your urine clear or pale yellow. Activity   After surgery, return to your normal activities slowly or when your health care provider says it is okay.  Start walking as soon as you can. Try to go a little farther each day.  Once your health care provider approves, do some sort of regular exercise. This helps prevent constipation. Bowel Movements  Go to the restroom when you have the urge to go. Do not hold it in.  Try drinking something hot to get a bowel movement started.  Keep track of how often you use the restroom. If you miss 2-3 bowel movements, talk to your health care provider about medicines that prevent constipation. Your health care provider may suggest a stool softener, laxative, or fiber supplement.  Only take over-the-counter or prescription medicines as directed by your health care provider.  Do not take other medicines without talking to your health care provider first. If you become constipated and take a medicine to make you have a bowel movement, the problem may get worse. Other kinds of medicine can also make the problem worse. SEEK MEDICAL CARE IF:  You used stool softeners or laxatives and still have not had a bowel movement within 24-48 hours after using them.  You have not had a bowel movement in 3 days. SEEK IMMEDIATE MEDICAL CARE IF:   Your constipation lasts for more than 4 days or gets worse.  You have bright red blood in your stool.  You have abdominal or rectal pain.  You have very bad cramping.  You have thin, pencil-like stools.  You have unexplained weight loss.  You have a fever or persistent symptoms for more than 2-3 days.  You have a fever and your symptoms suddenly get worse.   This information is not intended to replace advice given to you by your health care provider. Make sure you discuss any questions you have with your health care provider.   Document Released: 09/24/2012 Document Revised: 06/20/2014 Document Reviewed:  09/24/2012 Elsevier Interactive Patient Education [employer].

## 2015-08-26 DIAGNOSIS — E162 Hypoglycemia, unspecified: Secondary | ICD-10-CM

## 2015-08-26 LAB — GLUCOSE, CAPILLARY
GLUCOSE-CAPILLARY: 187 mg/dL — AB (ref 65–99)
Glucose-Capillary: 151 mg/dL — ABNORMAL HIGH (ref 65–99)

## 2015-08-26 MED ORDER — TRAMADOL HCL 50 MG PO TABS: 50.0000 mg | ORAL_TABLET | Freq: Four times a day (QID) | ORAL | Status: DC | PRN

## 2015-08-26 MED ORDER — LANTUS SOLOSTAR 100 UNIT/ML ~~LOC~~ SOPN: 25.0000 [IU] | PEN_INJECTOR | Freq: Every day | SUBCUTANEOUS | Status: DC

## 2015-08-26 MED ORDER — DOCUSATE SODIUM 100 MG PO CAPS: 100.0000 mg | ORAL_CAPSULE | Freq: Two times a day (BID) | ORAL | Status: DC

## 2015-08-26 MED ORDER — GABAPENTIN 100 MG PO CAPS: 100.0000 mg | ORAL_CAPSULE | Freq: Two times a day (BID) | ORAL | Status: DC

## 2015-08-26 MED ORDER — DICYCLOMINE HCL 20 MG PO TABS: 20.0000 mg | ORAL_TABLET | Freq: Three times a day (TID) | ORAL | Status: DC

## 2015-08-26 NOTE — Progress Notes (Signed)
Physical Therapy Treatment Patient Details Name: Sheri Reid MRN: [id number] DOB: 1940-07-16 Today's Date: 08/26/2015    History of Present Illness Pt is a 75 y.o. female who presents s/p L2-L5 anterior lateral lumbar fusion on 08/14/15. developed ileus with colonoscopy 3/10 and relief      PT Comments    Continues to make great progress. Anxious but capable of performing mobility tasks without physical assist. Son present and agrees patient can manage safely at home. Pt agrees she can safely ambulate with RW and get in/out of bed without assistance. Adequate for d/c from PT standpoint.  Follow Up Recommendations  Home health PT;Supervision for mobility/OOB     Equipment Recommendations  Rolling walker with 5" wheels    Recommendations for Other Services       Precautions / Restrictions Precautions Precautions: Fall;Back Precaution Comments: reviewed back precautions Required Braces or Orthoses: Spinal Brace Spinal Brace: Lumbar corset;Applied in sitting position Restrictions Weight Bearing Restrictions: No    Mobility  Bed Mobility Overal bed mobility: Needs Assistance Bed Mobility: Rolling;Sidelying to Sit;Sit to Sidelying Rolling: Supervision Sidelying to sit: Supervision     Sit to sidelying: Supervision General bed mobility comments: Reviewed log roll technique. Able to perform at a supervision level with extra time. Cues for safety ant technique.  Transfers Overall transfer level: Needs assistance Equipment used: Rolling walker (2 wheeled) Transfers: Sit to/from Stand Sit to Stand: Supervision         General transfer comment: Good power up. No assist needed. VC for hand placement. Performed from recliner and bed.  Ambulation/Gait Ambulation/Gait assistance: Supervision Ambulation Distance (Feet): 110 Feet Assistive device: Rolling walker (2 wheeled) Gait Pattern/deviations: Step-through pattern;Decreased stride length Gait velocity: Decreased Gait  velocity interpretation: Below normal speed for age/gender General Gait Details: Much better stride length and foot clearance. No buckling noted. Still moderately guarded but steady with RW for support.    Stairs            Wheelchair Mobility    Modified Rankin (Stroke Patients Only)       Balance                                    Cognition Arousal/Alertness: Awake/alert Behavior During Therapy: WFL for tasks assessed/performed Overall Cognitive Status: Within Functional Limits for tasks assessed                      Exercises      General Comments General comments (skin integrity, edema, etc.): Majority of session spent practicing bed mobility techniques per pt request and concern. Son present. Pt and son feel confident she can manage safely at home.      Pertinent Vitals/Pain Pain Assessment: 0-10 Pain Score:  ("It's mostly this brace pushing my belly.") Pain Location: abdomen Pain Descriptors / Indicators: Pressure Pain Intervention(s): Monitored during session;Repositioned    Home Living                      Prior Function            PT Goals (current goals can now be found in the care plan section) Acute Rehab PT Goals Patient Stated Goal: not stated PT Goal Formulation: With patient Time For Goal Achievement: 08/29/15 Potential to Achieve Goals: Good Progress towards PT goals: Progressing toward goals    Frequency  Min 5X/week    PT Plan  Current plan remains appropriate    Co-evaluation             End of Session Equipment Utilized During Treatment: Gait belt;Back brace Activity Tolerance: Patient tolerated treatment well Patient left: in chair;with call bell/phone within reach;with chair alarm set;with family/visitor present     Time: 0950-1005 PT Time Calculation (min) (ACUTE ONLY): 15 min  Charges:  $Therapeutic Activity: 8-22 mins                    G Codes:      Ellouise Newer Sep 08, 2015,  10:33 AM Elayne Snare, [place]

## 2015-08-26 NOTE — Care Management Note (Signed)
Case Management Note  Patient Details  Name: Sheri Reid MRN: [id number] Date of Birth: Dec 24, 1940  Subjective/Objective:                    Action/Plan: Patient discharging to home with [facility] services. CM met with the patient yesterday to discuss [facility] services and she selected [facility]. She wanted to talk with her son about the DME she will need at home and asked that CM see her today. Today, CM met with the patient and her son (who works for [employer]) and he states he has already arranged and had delivered the DME she needs to her home. CM asked if he needed any of the order sheets for the equipment and he denies the need for the orders. CM called and spoke with [facility] about her [facility] services and faxed the information requested. Bedside RN updated.   Expected Discharge Date:                  Expected Discharge Plan:  [place]  In-House Referral:     Discharge planning Services  CM Consult  Post Acute Care Choice:  Durable Medical Equipment, Home Health Choice offered to:     DME Arranged:   (son obtained all DME from outside company) DME Agency:     [place]:  RN, PT, Nurse's Aide [place] Agency:  [facility]  Status of Service:  Completed, signed off  Medicare Important Message Given:  Yes Date Medicare IM Given:    Medicare IM give by:    Date Additional Medicare IM Given:    Additional Medicare Important Message give by:     If discussed at [place] of Stay Meetings, dates discussed:    Additional Comments:  Pollie Friar, RN 08/26/2015, 1:28 PM

## 2015-08-26 NOTE — Progress Notes (Signed)
RN received discharge instructions. RN discussed discharge instructions with patient and son including follow up appointments x3, prescirption medications, wound care. Patient and family verbalized understanding of both. Patient states pain is 5/10, states she has horrible muscle spasms. MD was notified. No new orders. Neuro assessment unchanged. Waiting for pharmacy to d/c. Will continue to monitor until discharge. Prescriptions and discharge paper work given to patient.

## 2015-08-26 NOTE — Discharge Summary (Signed)
Physician Discharge Summary  Sheri Reid ZPH:[id number] DOB: 08-Jul-1940 DOA: 08/13/2015  PCP: Talmage Coin, MD  Admit date: 08/13/2015 Discharge date: 08/26/2015  Time spent: > 35 minutes  Recommendations for Outpatient Follow-up:  1. Please see below but patient had nodules in lung and adrenal gland that will need follow-up CT imaging or PET scan. Will defer to primary care physician to monitor 2. Patient will need to follow-up with neurosurgeon Dr. Vertell Limber on the 27th 3. Patient to follow-up with gastroenterologist for further evaluation recommendations   Discharge Diagnoses:  Active Problems:   Lumbar scoliosis   S/P spinal surgery   Nausea with vomiting   Diarrhea   Abdominal pain   Hypoglycemia   Diabetes mellitus with complication (HCC)   Ulcerative colitis (HCC)   Abdominal distension   Nausea & vomiting   Ileus ([place])   Discharge Condition: stable  Diet recommendation: regular diet  Filed Weights   08/15/15 1200  Weight: 67.8 kg (149 lb 7.6 oz)    History of present illness:  75 year old female with lumbar spondylolisthesis, insulin-dependent diabetes mellitus who was admitted for elective decompressive lumbar laminectomy (left axis L2-L5 anterior lateral interbody fusion with percutaneous pedicle screws on 08/13/2015). On 08/19/2015 hospitalist was consulted for episodes of intermittent hypoglycemia with ongoing nausea, vomiting, and abdominal pain. Patient did not have any bowel movement since 08/13/2015. She had an NG tube placed for about 2 days for decompression that was removed. She had loose bowel movement on 3/6 following laxatives. Subsequently she developed colonic ileus. Hospitalist consulted for further management.   Hospital Course:  Hypoglycemia Discharge on decreased dose of Lantus and continue prior to admission home medication regimen   Abdominal distention secondary to colonic obstruction CT abdomen done showed significant colonic distention  suggestive of ogilivie's home. Underwent colonoscopy with decompression on 08/21/2015. Showed significant dilatation the ascending colon and into cecum -Symptoms appears to slowly improving. Keep k>4. Discontinued Vicodin.  -Appreciate GI follow-up. Recommend avoiding narcotics. Symptoms improving daily with Daily glycerin suppositories and MiraLAX. Recommend frequent position changes and ambulation. serial abdominal exam. -Discussed with Dr. Collene Mares. Patient still has abdominal distention although improving.    ? Left psoas muscle fluid collection CT of the abdomen done showed possible abscess but she has no clinical signs or symptoms. Neurosurgeon also think this is likely postoperative changes/seroma  Lumbar spinal surgery On low-dose when necessary Dilaudid. Did not require pain medication last few days but today complaining of low back pain. Neurosurgery following and have set up outpatient appointment with Dr Vertell Limber.   Right lower lobe pulmonary nodule CT abdomen shows 2.2 cm subpleural nodular opacity in the right lower lobe. Has smoking history.. Also has 2.4 1.6 cm left adrenal nodule. A CT of her chest with contrast done today shows 2.3 cm subpleural density with differential including postinfectious versus? Pulmonary infarct and malignancy. Patient does not have any clinical signs or symptoms to suggest PE.  - Recommended patient follow-up with primary care physician for further evaluation and recommendations for   History of ulcerative colitis Follows with Dr. Collene Mares. Home medication showed that patient was on mesalamine which was continued during hospitalization. Dr. Collene Mares recommends to discontinue it as it has not been helping her with her symptoms.  Essential Hypertension Continue home medications   Procedures:  Elective laminectomy. Please see neurosurgeon notes in EMR  Consultations:  GI  Neurosurgery  Discharge Exam: Filed Vitals:   08/26/15 0210 08/26/15 0625   BP: 130/58 129/67  Pulse: 81 88  Temp: 98.5 F (36.9 C) 98 F (36.7 C)  Resp: 18 18    General: Pt in nad, alert and awake Cardiovascular: rrr, no mrg Respiratory: cta bl, no wheezs  Discharge Instructions   Discharge Instructions    Call MD for:  difficulty breathing, headache or visual disturbances    Complete by:  As directed      Call MD for:  severe uncontrolled pain    Complete by:  As directed      Call MD for:  temperature >100.4    Complete by:  As directed      Diet - low sodium heart healthy    Complete by:  As directed      Discharge instructions    Complete by:  As directed   Follow up with neurosurgeon as per their recommendations prior to your hospital discharge.   Also follow up with your primary care physician for further evaluation of mentioned nodules on CT scan.     Increase activity slowly    Complete by:  As directed           Current Discharge Medication List    START taking these medications   Details  dicyclomine (BENTYL) 20 MG tablet Take 1 tablet (20 mg total) by mouth 3 (three) times daily before meals. Qty: 90 tablet, Refills: 0    docusate sodium (COLACE) 100 MG capsule Take 1 capsule (100 mg total) by mouth 2 (two) times daily. Qty: 10 capsule, Refills: 0    gabapentin (NEURONTIN) 100 MG capsule Take 1 capsule (100 mg total) by mouth 2 (two) times daily. Qty: 60 capsule, Refills: 0      CONTINUE these medications which have CHANGED   Details  LANTUS SOLOSTAR 100 UNIT/ML Solostar Pen Inject 25 Units into the skin at bedtime. Qty: 15 mL, Refills: 11      CONTINUE these medications which have NOT CHANGED   Details  acetaminophen (TYLENOL) 500 MG tablet Take 500-1,000 mg by mouth 2 (two) times daily as needed for moderate pain.    Ascorbic Acid (VITAMIN C) 1000 MG tablet Take 1,000 mg by mouth daily.    Biotin (BIOTIN 5000) 5 MG CAPS Take 5,000 mg by mouth daily.    Coenzyme Q10 (CO Q-10) 200 MG CAPS Take 200 mg by mouth  daily.    Cranberry 500 MG CAPS Take 500 mg by mouth daily.    ergocalciferol (VITAMIN D2) 50000 units capsule Take 50,000 Units by mouth every 14 (fourteen) days.    levocetirizine (XYZAL) 5 MG tablet Take 5 mg by mouth daily.    lisinopril (PRINIVIL,ZESTRIL) 5 MG tablet Take 10 mg by mouth daily.    metFORMIN (GLUCOPHAGE) 1000 MG tablet Take 1,000 mg by mouth 2 (two) times daily with a meal.     Multiple Vitamin (MULTIVITAMIN) tablet Take 1 tablet by mouth daily.    NOVOLOG FLEXPEN 100 UNIT/ML FlexPen Inject 1-2 Units into the skin daily as needed for high blood sugar.     Probiotic Product (PROBIOTIC DAILY PO) Take 3,000 mcg by mouth daily.    traMADol (ULTRAM) 50 MG tablet Take 50 mg by mouth every 6 (six) hours as needed for moderate pain.     VICTOZA 18 MG/3ML SOPN Inject 1.8 mg into the skin every morning.     vitamin B-12 (CYANOCOBALAMIN) 1000 MCG tablet Take 3,000 mcg by mouth daily.    tiZANidine (ZANAFLEX) 2 MG tablet Take 1 mg by mouth at bedtime.  STOP taking these medications     APRISO 0.375 g 24 hr capsule      HYDROcodone-acetaminophen (NORCO/VICODIN) 5-325 MG tablet      VOLTAREN 1 % GEL        No Known Allergies    The results of significant diagnostics from this hospitalization (including imaging, microbiology, ancillary and laboratory) are listed below for reference.    Significant Diagnostic Studies: Dg Lumbar Spine Complete  08/13/2015  CLINICAL DATA:  Post posterior lumbar fusion L2, L3, L4 and L5 level. EXAM: DG C-ARM GT 120 MIN; LUMBAR SPINE - COMPLETE 4+ VIEW CONTRAST:  None FLUOROSCOPY TIME:  : Fluoroscopy Time (in minutes and seconds):  7 minutes 18 seconds Number of Acquired Images:  4 COMPARISON:  None. FINDINGS: Four views of the lumbar spine submitted. There is posterior metallic fusion with metallic rods and transpedicular screws at L2-L3, L3-L4, L4-L5 level. There is anatomic alignment. Postsurgical disc spacers are noted at L2-L3,  L3-L4 and L4-L5 disc space level. Atherosclerotic calcifications of abdominal aorta. IMPRESSION: Posterior metallic fusion with transpedicular screws and rods at L2-L5 level. There is anatomic alignment. Please see the operative report. Electronically Signed   By: Lahoma Crocker M.D.   On: 08/13/2015 12:29   Ct Chest W Contrast  08/24/2015  CLINICAL DATA:  Significant back pain, pulmonary nodule on recent CTA abdomen EXAM: CT CHEST WITH CONTRAST TECHNIQUE: Multidetector CT imaging of the chest was performed during intravenous contrast administration. CONTRAST:  42m OMNIPAQUE IOHEXOL 300 MG/ML  SOLN COMPARISON:  08/21/2015 FINDINGS: The lungs are well aerated bilaterally. There is again noted a somewhat ovoid shaped subpleural density which measures approximately 2.3 cm in greatest dimension. The variation in size is related to the lack of complete coverage on the prior exam. No other focal nodular density is seen. The thoracic inlet is within normal limits. Calcifications of the thoracic aorta are noted without dissection. Limited evaluation of the pulmonary artery shows no large central pulmonary embolus. No significant hilar or mediastinal adenopathy is noted. Mild Coronary calcifications are seen. Visualized upper abdomen again demonstrates a lesion within the left adrenal gland better evaluated on prior exam. The osseous structures are within normal limits. Some air is noted along the left chest wall similar to that seen on prior CT examination and likely related to the recent surgery. IMPRESSION: 2.3 cm subpleural density identified. There is some spiculation identified. No lymphadenopathy seen. Although this may be post infectious in etiology or possibly related to pulmonary infarct, the possibility of neoplasm deserves consideration. PET-CT may be helpful for further characterization. Alternatively a short-term followup could be performed. Stable changes in the left adrenal gland Electronically Signed   By:  MInez CatalinaM.D.   On: 08/24/2015 11:01   Ct Abdomen Pelvis W Contrast  08/21/2015  CLINICAL DATA:  Nausea, vomiting, and abdominal pain. Recent lumbar laminectomy. EXAM: CT ABDOMEN AND PELVIS WITH CONTRAST TECHNIQUE: Multidetector CT imaging of the abdomen and pelvis was performed using the standard protocol following bolus administration of intravenous contrast. CONTRAST:  1033mOMNIPAQUE IOHEXOL 300 MG/ML  SOLN COMPARISON:  No prior CT. FINDINGS: Lower chest: 2.2 cm subpleural nodular opacity in the right lower lobe with questionable surrounding spiculation. Linear atelectasis in the right middle lobe. Coronary artery calcifications. Liver: No focal lesion. Hepatobiliary: Gallbladder decompressed.  No calcified stone. Pancreas: No ductal dilatation or inflammation. Atrophic parenchyma. Spleen: Normal. Adrenal glands: 2.4 x 1.6 cm left adrenal nodule. Mild right adrenal thickening. Kidneys: Symmetric renal enhancement and excretion.  There is prominence of both proximal renal collecting systems without ureteral dilatation. 2.9 cm are left renal cyst. Stomach/Bowel: Stomach is decompressed. Proximal small bowel loops are normal. Mild gaseous distention of pelvic and distal small bowel loops. Gaseous distention of cecum and ascending colon, cecum measuring up to 10 mm. No mesenteric twisting or volvulus. Liquid stool throughout the colon with distention of the ascending, transverse, and proximal descending colon. Liquid and solid stool in the sigmoid colon, with distal colonic diverticulosis. Portions of the sigmoid colon are obscured by streak artifact from hip prosthesis. No colonic wall thickening. Vascular/Lymphatic: No retroperitoneal adenopathy. Abdominal aorta is normal in caliber. Moderate atherosclerosis without aneurysm. Reproductive: Uterus likely remains in situ, detailed evaluation obscured by streak artifact. Bladder: Distended but obscured. Other: Patient is post L2 through L5 posterior fusion.  There is an irregular air in fluid collection within the left psoas muscle adjacent to the surgical hardware. This measures 5.3 x 3.7 x 8.4 cm. Air in fluid collection in the surgical bed posteriorly, with scattered air tracking about the subcutaneous tissues of the left flank. Minimal air tracks anterior to the left iliopsoas muscle and retroperitoneum. No intraperitoneal fluid collection. Musculoskeletal: Posterior fusion L2 through L5 with interbody spacers. No bony destructive change. Air and fluid in the surgical bed posteriorly without rim enhancement. Bilateral hip arthroplasties. IMPRESSION: 1. Post recent lumbar spine surgery with irregular air and fluid collection in the left psoas muscle, 5.3 x 3.7 x 8.4 cm, concerning for postoperative abscess. Air tracks along the iliopsoas muscle in the left retroperitoneum, as well as subcutaneous tissues. Ill-defined air and fluid in the surgical bed posteriorly is likely sequela of recent surgery without rim enhancement of the subcutaneous fluid. 2. Distended fluid-filled colon, most significantly affecting the ascending colon and cecum. Findings suggest colonic ileus. No evidence of volvulus. Mild small bowel dilatation distally. 3. Right lower lobe 2.2 cm subpleural nodular opacity, partially included in the field of view, however concerning for pulmonary nodule/malignancy, primary bronchogenic versus metastatic. Recommend chest CT for characterization. There is a 2.4 cm left adrenal nodule, nonspecific, however concerning for metastasis in this setting. No prior exams available for comparison. These results will be called to the ordering clinician or representative by the Radiologist Assistant, and communication documented in the PACS or zVision Dashboard. Electronically Signed   By: Jeb Levering M.D.   On: 08/21/2015 03:04   Dg Abd 2 Views  08/23/2015  CLINICAL DATA:  75 year old female with recent back surgery.  Ileus. EXAM: ABDOMEN - 2 VIEW COMPARISON:   Abdominal radiograph 08/22/2015. 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 Gas and stool are seen scattered throughout the colon extending to the level of the distal rectum. No pathologic distension of small bowel is noted. No gross evidence of pneumoperitoneum. IMPRESSION: 1. Nonobstructive bowel gas pattern. No evidence of obstruction or ileus on today's examination. 2. No pneumoperitoneum. 3. Previously identified gas and fluid collection in the left psoas muscle is obscured by bowel gas on today's examination. Electronically Signed   By: Vinnie Langton M.D.   On: 08/23/2015 10:08   Dg Abd 2 Views  08/22/2015  CLINICAL DATA:  Abdominal distension EXAM: ABDOMEN - 2 VIEW COMPARISON:  08/21/2015 CT abdomen/pelvis FINDINGS: There is mild diffuse gaseous distention of the colon, decreased in the interval, with particularly decreased distention of the cecum. No evidence of pneumatosis or pneumoperitoneum. No disproportionately dilated small bowel loops. Limited visualization of  retroperitoneal gas in the left paraspinal soft tissues at the L2-3 level. Status post bilateral posterior spinal fusion from L2-L5, with no evidence of hardware fracture or loosening. Visualized lung bases appear clear. IMPRESSION: 1. Mild colonic ileus, significantly improved. 2. No evidence of small-bowel obstruction. 3. Limited visualization of known retroperitoneal gas in the left psoas, as better seen on CT study from 1 day prior. Electronically Signed   By: Ilona Sorrel M.D.   On: 08/22/2015 18:00   Dg Abd Portable 1v  08/20/2015  CLINICAL DATA:  Small bowel obstruction. Abdominal distension, postop for back surgery EXAM: PORTABLE ABDOMEN - 1 VIEW COMPARISON:  08/19/2015 FINDINGS: Mild gaseous distended small bowel loops mid abdomen/pelvis. Postsurgical changes lumbar spine with posterior fusion at L2-L5 level. Bilateral hip prosthesis.  Significant gaseous distension of the right colon up to 13 cm. Moderate gaseous distension of the transverse colon. Findings highly suspicious for significant ileus or partial obstruction. IMPRESSION: Mild gaseous distended small bowel loops mid abdomen/ pelvis suspicious for ileus Significant gaseous distension of the right colon up to 13 cm. Moderate gaseous distension of transverse colon, highly suspicious for significant ileus or partial obstruction Electronically Signed   By: Lahoma Crocker M.D.   On: 08/20/2015 08:16   Dg Abd Portable 1v  08/19/2015  CLINICAL DATA:  Abdominal pain and distension after back surgery 08/13/2015 EXAM: PORTABLE ABDOMEN - 1 VIEW COMPARISON:  08/16/2015 FINDINGS: Interval increase in the diameter of the transverse colon which is gases sleep distended to about 6 cm. The cecum is again distended, and there are numerous air-filled loops of small bowel as well. No caliber transition identified. Postoperative change lumbar spine with rods noted. IMPRESSION: Findings most consistent with postoperative ileus. Electronically Signed   By: Skipper Cliche M.D.   On: 08/19/2015 12:54   Dg Abd Portable 1v  08/16/2015  CLINICAL DATA:  Acute onset of nausea, vomiting, abdominal distention and generalized abdominal pain. Initial encounter. EXAM: PORTABLE ABDOMEN - 1 VIEW COMPARISON:  Lumbar spine radiographs performed 08/13/2015 FINDINGS: The colon is partially filled with air. The visualized bowel gas pattern is grossly unremarkable. No free intra-abdominal air is seen, though evaluation for free air is limited on a single supine view. Bilateral hip arthroplasties are noted. This patient is status post lumbar spinal fusion at L2-L5. No acute osseous abnormalities are seen. IMPRESSION: Colon partially filled with air. Visualized bowel gas pattern grossly unremarkable. No free intra-abdominal air seen. Electronically Signed   By: Garald Balding M.D.   On: 08/16/2015 01:27   Dg C-arm Gt 120  Min  08/13/2015  CLINICAL DATA:  Post posterior lumbar fusion L2, L3, L4 and L5 level. EXAM: DG C-ARM GT 120 MIN; LUMBAR SPINE - COMPLETE 4+ VIEW CONTRAST:  None FLUOROSCOPY TIME:  : Fluoroscopy Time (in minutes and seconds):  7 minutes 18 seconds Number of Acquired Images:  4 COMPARISON:  None. FINDINGS: Four views of the lumbar spine submitted. There is posterior metallic fusion with metallic rods and transpedicular screws at L2-L3, L3-L4, L4-L5 level. There is anatomic alignment. Postsurgical disc spacers are noted at L2-L3, L3-L4 and L4-L5 disc space level. Atherosclerotic calcifications of abdominal aorta. IMPRESSION: Posterior metallic fusion with transpedicular screws and rods at L2-L5 level. There is anatomic alignment. Please see the operative report. Electronically Signed   By: Lahoma Crocker M.D.   On: 08/13/2015 12:29    Microbiology: Recent Results (from the past 240 hour(s))  C difficile quick scan w PCR reflex  Status: None   Collection Time: 08/19/15 10:57 PM  Result Value Ref Range Status   C Diff antigen NEGATIVE NEGATIVE Final   C Diff toxin NEGATIVE NEGATIVE Final   C Diff interpretation Negative for toxigenic C. difficile  Final  H.pylori Antigen, Stool     Status: None   Collection Time: 08/19/15 10:57 PM  Result Value Ref Range Status   Specimen Description PER RECTUM  Final   Special Requests NONE  Final   H. pylori ag, stool   Final    NOT DETECTED Antimicrobials,proton pump inhibitors and bismuth preparations are known to suppress H.pylori and ingestion of these prior to testing may cause a false negative result. If a negative result is obtained for a patient that has  ingested these  compounds within two weeks prior of performing the H.pylori test, results may be falsely negative and should be repeated with a new specimen two weeks after discontinuing treatment. Performed at [facility]    Report Status 08/20/2015 FINAL  Final     Labs: Basic Metabolic  Panel:  Recent Labs Lab 08/19/15 1405 08/21/15 0400 08/23/15 0557 08/25/15 0820  NA 138 137 137 138  K 4.1 3.6 4.1 3.7  CL 98* 104 100* 103  CO2 33* 25 26 24   GLUCOSE 185* 93 180* 87  BUN 9 5* 10 16  CREATININE 0.92 0.78 0.89 0.84  CALCIUM 8.8* 8.7* 8.7* 9.4   Liver Function Tests:  Recent Labs Lab 08/19/15 1405  AST 27  ALT 27  ALKPHOS 46  BILITOT 0.6  PROT 6.0*  ALBUMIN 2.7*   No results for input(s): LIPASE, AMYLASE in the last 168 hours. No results for input(s): AMMONIA in the last 168 hours. CBC:  Recent Labs Lab 08/19/15 1405  WBC 9.8  NEUTROABS 7.2  HGB 11.9*  HCT 34.8*  MCV 92.8  PLT 283   Cardiac Enzymes: No results for input(s): CKTOTAL, CKMB, CKMBINDEX, TROPONINI in the last 168 hours. BNP: BNP (last 3 results) No results for input(s): BNP in the last 8760 hours.  ProBNP (last 3 results) No results for input(s): PROBNP in the last 8760 hours.  CBG:  Recent Labs Lab 08/25/15 1148 08/25/15 1617 08/25/15 2200 08/26/15 0623 08/26/15 1146  GLUCAP 169* 218* 164* 151* 187*       Signed:  Velvet Bathe MD.  [facility] 08/26/2015, 12:42 PM

## 2015-08-26 NOTE — Clinical Social Work Note (Signed)
CSW received referral for SNF.  Case discussed with case manager and plan is to discharge home.  CSW to sign off please re-consult if social work needs arise.  Jones Broom. [place], MSW, [place]

## 2015-08-26 NOTE — Progress Notes (Signed)
Patient given rx for tramadol, updated on medications list

## 2017-12-22 IMAGING — CT CT CHEST W/ CM
2 of 4 series · 15 of 36 positions shown, 18 images · IV contrast (Omni 300)
Comparison: 08/21/2015

CLINICAL DATA: Significant back pain, pulmonary nodule on recent
CTA abdomen

EXAM:
CT CHEST WITH CONTRAST
TECHNIQUE: Multidetector CT imaging of the chest was performed during
intravenous contrast administration.
CONTRAST:  75mL OMNIPAQUE IOHEXOL 300 MG/ML  SOLN

[Series 2: chest with 5mm st · axial · 0.67mm/px · z∈[+1263,+1523]mm · 12 of 62 slices shown, 15 images]
[im 5/62  mediastinal]
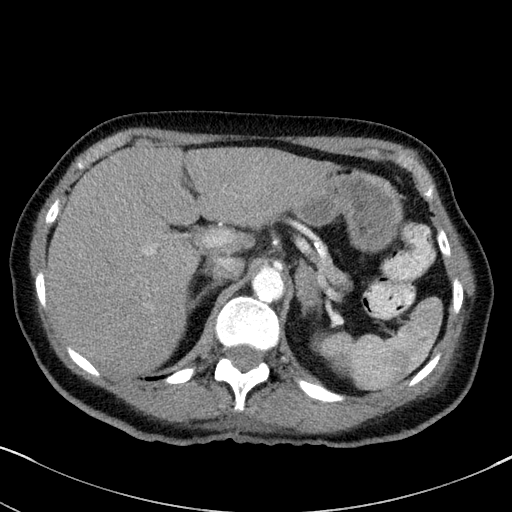
[im 5/62  lung]
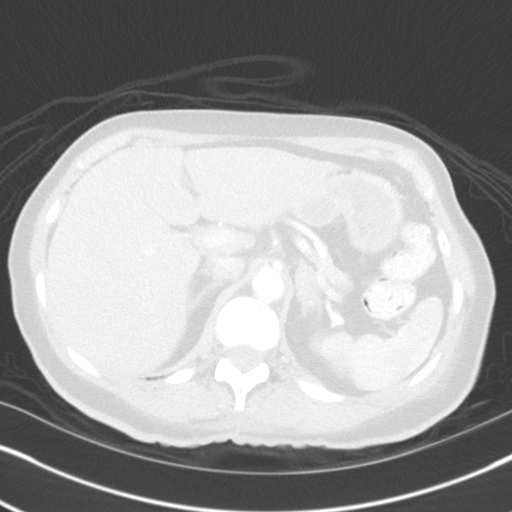
[im 10/62  lung]
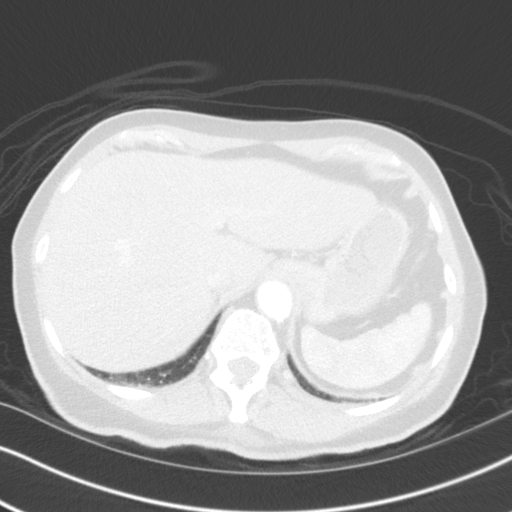
[im 15/62  lung]
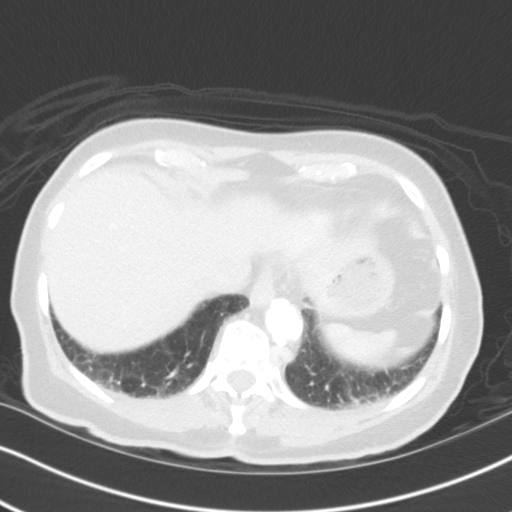
[im 19/62  lung]
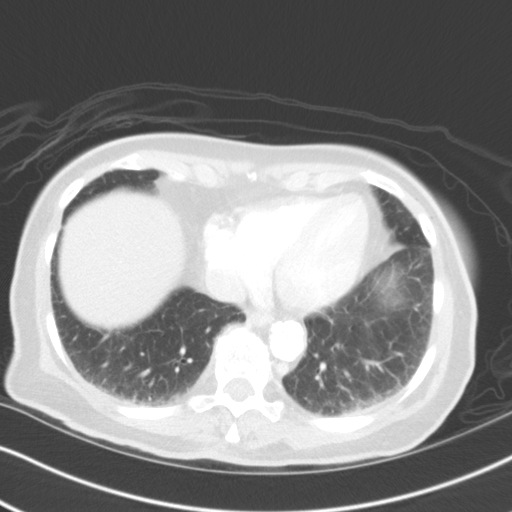
[im 24/62  mediastinal]
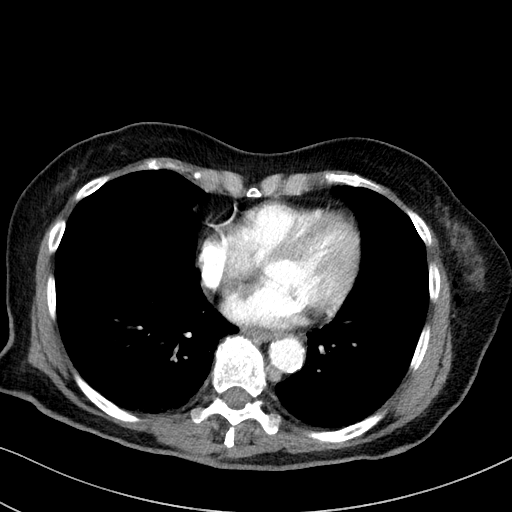
[im 24/62  lung]
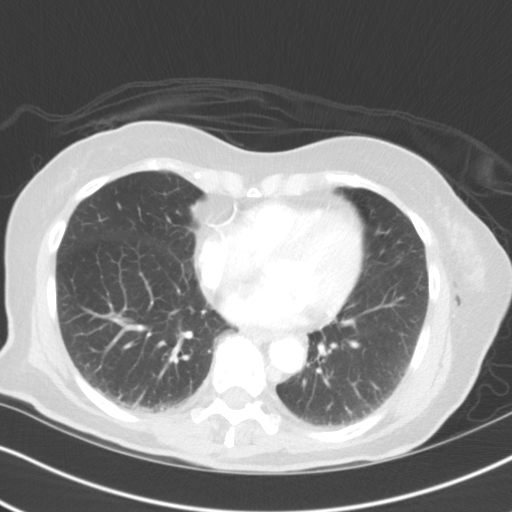
[im 29/62  lung]
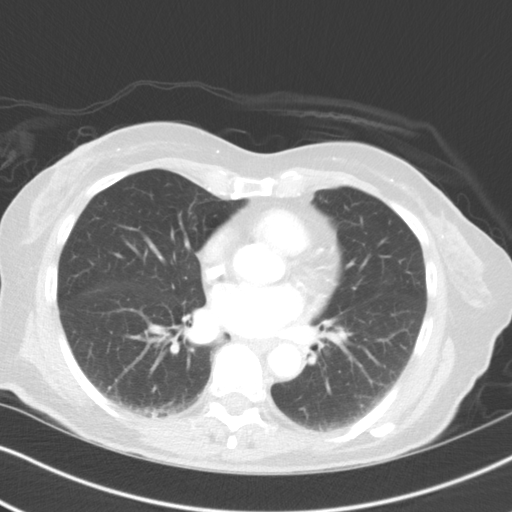
[im 33/62  lung]
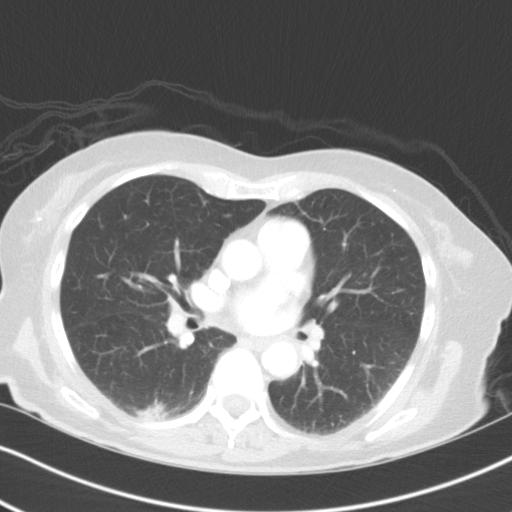
[im 38/62  lung]
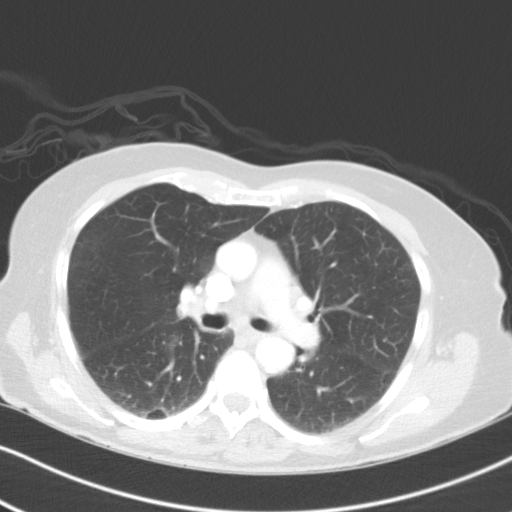
[im 43/62  mediastinal]
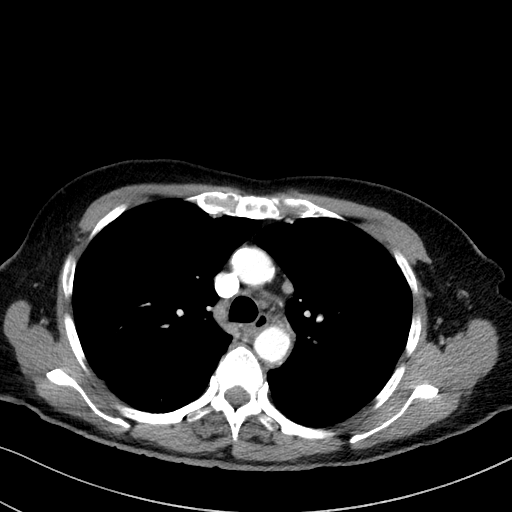
[im 43/62  lung]
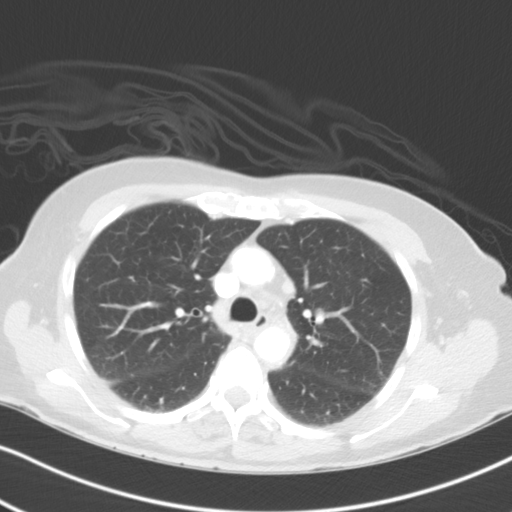
[im 47/62  lung]
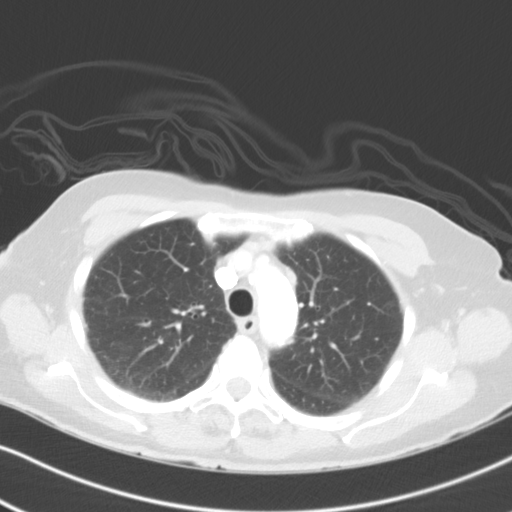
[im 52/62  lung]
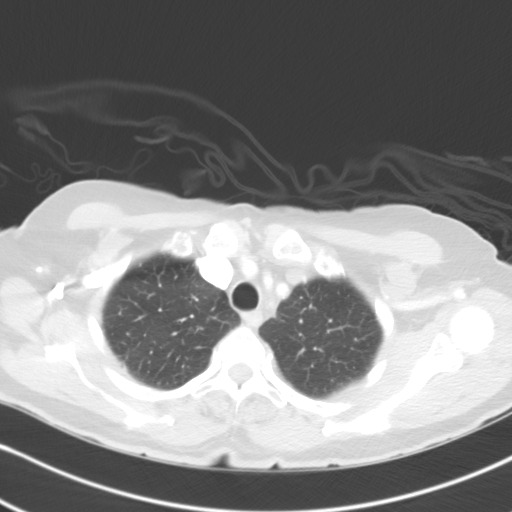
[im 57/62  lung]
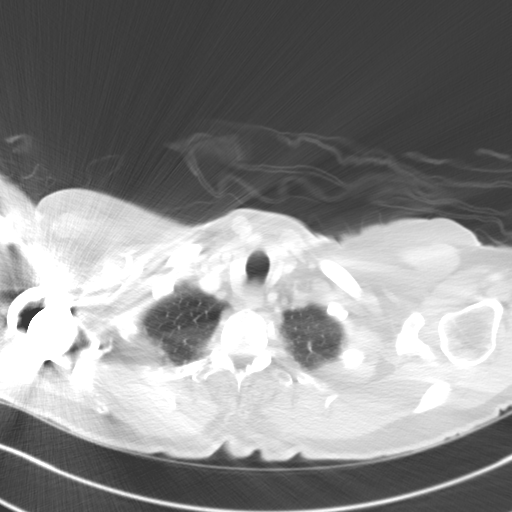

[Series 4: chest with 3mm st cor · coronal · 0.60mm/px · 3 of 72 slices shown]
[im 15/72  lung]
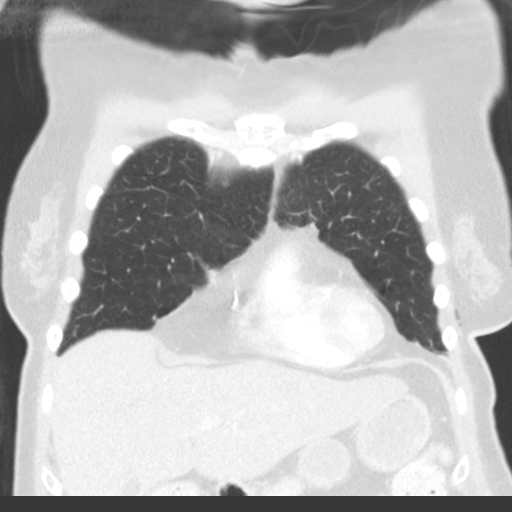
[im 29/72  lung]
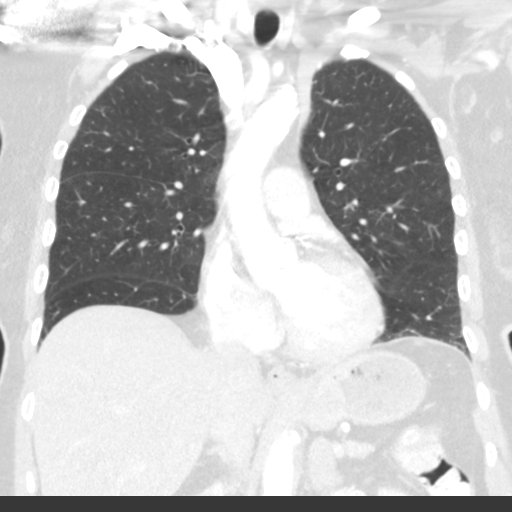
[im 43/72  lung]
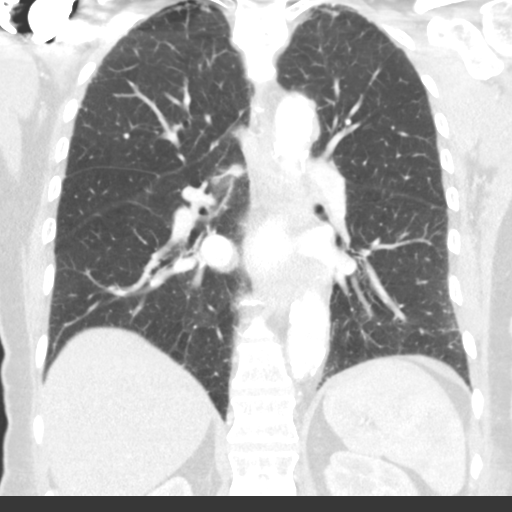

[15 of 36 positions shown; findings below may reference images not displayed]

FINDINGS: The lungs are well aerated bilaterally. There is again noted a
somewhat ovoid shaped subpleural density which measures
approximately 2.3 cm in greatest dimension. The variation in size is
related to the lack of complete coverage on the prior exam. No other
focal nodular density is seen.

The thoracic inlet is within normal limits. Calcifications of the
thoracic aorta are noted without dissection. Limited evaluation of
the pulmonary artery shows no large central pulmonary embolus. No
significant hilar or mediastinal adenopathy is noted. Mild Coronary
calcifications are seen.

Visualized upper abdomen again demonstrates a lesion within the left
adrenal gland better evaluated on prior exam. The osseous structures
are within normal limits. Some air is noted along the left chest
wall similar to that seen on prior CT examination and likely related
to the recent surgery.
IMPRESSION: 2.3 cm subpleural density identified. There is some spiculation
identified. No lymphadenopathy seen. Although this may be post
infectious in etiology or possibly related to pulmonary infarct, the
possibility of neoplasm deserves consideration. PET-CT may be
helpful for further characterization. Alternatively a short-term
followup could be performed.

Stable changes in the left adrenal gland

## 2019-01-16 ENCOUNTER — Encounter: Payer: Self-pay | Admitting: Nurse Practitioner

## 2019-04-06 NOTE — Progress Notes (Signed)
Subjective:    Patient ID: Sheri Reid, female    DOB: [birth year], 78 y.o.   MRN: [id number]  HPI  Sheri Reid. Sheri Reid is an 78 year old female with a past medical history of depression, arthritis, DM II, neuropathy, essential tremor and Parkinsonian syndrome. History of a post op ileus following a lumbar fusion 08/13/2015 which required a therapeutic colonoscopy for decompression 08/21/2015.  S/P s/p  L2-S1 posterior fusion and L5-S1 TLIF. She present today for further evaluation regarding persistent diarrhea. Her son is present and he is assisting with obtaining her history. She reports having diarrhea since June 2020 which started while she was in rehab s/p  L2-S1 posterior fusion and L5-S1 TLIF.  Stool cultures were positive for Camylobacter 01/16/2019.  C. difficile toxin PCR was negative.  She was initially prescribed Cipro 750 mg 1 tab bid for 3 days as prescribed by her PCP. Her diarrhea continued so she was prescribed Cipro 500 mg p.o. twice daily for 7 days and Metronidazole 500 mg 1 p.o. twice daily for 7 days which was started 01/24/2019.  Her diarrhea persisted so she was prescribed Azithromycin 500 mg 1 tab p.o. for 3 days on 02/06/2019.  Since then, she continued to have diarrhea and she was prescribed Xifaxan 550 mg 1 p.o. 3 times daily for 2 weeks and a bacteria probiotic without improvement.  She denies having any upper or lower abdominal pain.  She is taking Omeprazole 20 mg once daily, she is not unable to tell me how long she has been taking this medication.  No GERD symptoms.  No melena.  No fever, sweats or chills. No weight loss. She is passing 1 to 3 watery nonbloody diarrhea daily. She is taking Imodium 1 bid. No dysphagia, heartburn or stomach pain. She complains of having nausea and vomiting. She vomits sporadically, usually after eating. No night time vomiting. Early satiety. She is urinating clear yellow urine. Her appetite is fair. She eats ice cream, yogurt and drinks milk most days.  She was informed by her PCP that her Hg level was low so she was prescribed Ferrous Sulfate 367m one tab po QD on 01/18/2019.   Labs 01/16/2019: CBC with a WBC 7.91.  Hemoglobin 11.0.  Hematocrit 37.1.  MCV 91.4.  Platelet 380.  [facility] - Dilated in the ascending colon and in the cecum. - Decompression was performed with suctioning of the luminal air.   -External palpation of the abdomen revealed a significant drop in her   distension and the patient reported an improvement. A decompression tube was not inserted as these   small caliber tubes clog shortly after deployment. - No specimens collected.  [place]  Family History: Mother deceased, history of hypertension.  Father deceased with history of diabetes and hypertension.  Social history: She is divorced.  Frequent alcohol use.  Non-smoker.  Past Medical History:  Diagnosis Date   Arthritis    "back" (08/13/2015)   Chronic lower back pain    Constipation due to pain medication    Depression    with pain   Diabetic peripheral neuropathy (HCC)    GERD (gastroesophageal reflux disease)    Tremor of unknown origin    family has it   Type II diabetes mellitus ([place]    Ulcerative colitis ([facility]    Past Surgical History:  Procedure Laterality Date   ANTERIOR LAT LUMBAR FUSION Left 08/13/2015   Procedure:  Left Access L2-3 L3-4 L4-5 Anterolateral interbody fusion ;  Surgeon: Erline Levine, MD;  Location: [place] [facility];  Service: Neurosurgery;  Laterality: Left;  Left Access L2-3 L3-4 L4-5 Anterolateral interbody fusion    ANTERIOR LATERAL LUMBAR FUSION WITH PERCUTANEOUS SCREW 3 LEVEL Left 08/13/2015   L2-3 L3-4 L4-5 Anterolateral interbody fusion with percutaneous pedicle screws    BACK SURGERY     CATARACT EXTRACTION W/ INTRAOCULAR LENS  IMPLANT, BILATERAL Bilateral    COLONOSCOPY     COLONOSCOPY N/A 08/21/2015   Procedure: COLONOSCOPY;   Surgeon: Carol Ada, MD;  [facility];  Service: Endoscopy;  Laterality: N/A;   JOINT REPLACEMENT     LUMBAR PERCUTANEOUS PEDICLE SCREW 3 LEVEL N/A 08/13/2015   Procedure: L2-3 L3-4 L4-5 Anterolateral interbody fusion with percutaneous pedicle screws;  Surgeon: Erline Levine, MD;  Location: [place] [facility];  Service: Neurosurgery;  Laterality: N/A;  L2-3 L3-4 L4-5 Anterolateral interbody fusion with percutaneous pedicle screws   TOTAL HIP ARTHROPLASTY Bilateral 2007-2009   TOTAL SHOULDER ARTHROPLASTY Right 07/10/11   Current Outpatient Medications on File Prior to Visit  Medication Sig Dispense Refill   carbidopa-levodopa (SINEMET IR) 25-100 MG tablet Take 1 tablet by mouth 3 (three) times daily.     cholecalciferol (VITAMIN D3) 25 MCG (1000 UT) tablet Take 2,000 Units by mouth daily.     omeprazole (PRILOSEC) 20 MG capsule Take 20 mg by mouth daily.     primidone (MYSOLINE) 50 MG tablet Take 1 tablet by mouth daily.     Turmeric 500 MG CAPS Take 1,000 mg by mouth daily.     acetaminophen (TYLENOL) 500 MG tablet Take 500-1,000 mg by mouth 2 (two) times daily as needed for moderate pain.     Ascorbic Acid (VITAMIN C) 1000 MG tablet Take 1,000 mg by mouth daily.     Biotin (BIOTIN 5000) 5 MG CAPS Take 5,000 mg by mouth daily.     Coenzyme Q10 (CO Q-10) 200 MG CAPS Take 200 mg by mouth daily.     Cranberry 500 MG CAPS Take 500 mg by mouth daily.     dicyclomine (BENTYL) 20 MG tablet Take 1 tablet (20 mg total) by mouth 3 (three) times daily before meals. 90 tablet 0   docusate sodium (COLACE) 100 MG capsule Take 1 capsule (100 mg total) by mouth 2 (two) times daily. 10 capsule 0   ergocalciferol (VITAMIN D2) 50000 units capsule Take 50,000 Units by mouth every 14 (fourteen) days.     gabapentin (NEURONTIN) 100 MG capsule Take 1 capsule (100 mg total) by mouth 2 (two) times daily. 60 capsule 0   LANTUS SOLOSTAR 100 UNIT/ML Solostar Pen Inject 25 Units into the skin at  bedtime. 15 mL 11   levocetirizine (XYZAL) 5 MG tablet Take 5 mg by mouth daily.     lisinopril (PRINIVIL,ZESTRIL) 5 MG tablet Take 10 mg by mouth daily.     metFORMIN (GLUCOPHAGE) 1000 MG tablet Take 1,000 mg by mouth 2 (two) times daily with a meal.      Multiple Vitamin (MULTIVITAMIN) tablet Take 1 tablet by mouth daily.     NOVOLOG FLEXPEN 100 UNIT/ML FlexPen Inject 1-2 Units into the skin daily as needed for high blood sugar.      Probiotic Product (PROBIOTIC DAILY PO) Take 3,000 mcg by mouth daily.     tiZANidine (ZANAFLEX) 2 MG tablet Take 1 mg by mouth at bedtime.     traMADol (ULTRAM) 50 MG tablet Take 1 tablet (  50 mg total) by mouth every 6 (six) hours as needed for moderate pain. 30 tablet 0   VICTOZA 18 MG/3ML SOPN Inject 1.8 mg into the skin every morning.      vitamin B-12 (CYANOCOBALAMIN) 1000 MCG tablet Take 3,000 mcg by mouth daily.     No current facility-administered medications on file prior to visit.   No Known Allergies  Family History: Mother deceased, history of hypertension.  Father deceased with history of diabetes and hypertension.  Social history: She is divorced.  Frequent alcohol use.  Non-smoker.  Review of Systems see HPI, all other systems reviewed and are negative     Objective:   Physical Exam  BP (!) 147/72    Pulse 88    Temp 98 F (36.7 C)    Ht 5' 5"  (1.651 m)    Wt 136 lb 8 oz (61.9 kg)    BMI 22.71 kg/m  General: 78 year old female ambulates with assistance of a walker alert in no acute distress Eyes: Sclera nonicteric, conjunctiva pink Mouth: Dentition intact, no ulcers or lesions Neck: Supple, no lymphadenopathy, left thyroid lobe greater than the right without distinct nodule Heart: Regular rate and rhythm, soft murmur Lungs: Breath sounds clear throughout Abdomen: Soft, nondistended, nontender, no masses or organomegaly, positive bowel sounds to all 4 quadrants Extremities: No edema Neuro: Alert and oriented x4, speech is  clear, gait is somewhat guarded with the use of a cane, upper extremity tremors     Assessment & Plan:   59.  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 difficile at this time, pancreatic elastase level -CBC, CMP, CRP and celiac panel -Florastor probiotic 1 capsule twice daily and continue bacteria probiotic of choice once daily -She does not wish to avoid dairy products, patient to take Lactaid 1 to 2 tablets with each dairy product -Imodium 1 tab twice daily.  Pepto-Bismol 2 tabs by mouth twice daily.  Stop if no bowel movement in 24 hours. -If GI pathogen is negative she may try Benefiber  2.  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  Normocytic anemia.  No obvious signs of active GI bleeding. - CBC, iron panel  -If she has iron deficiency anemia we will further discuss scheduling an EGD and colonoscopy  Follow-up in the office in 4 to 6 weeks

## 2019-04-08 ENCOUNTER — Encounter (INDEPENDENT_AMBULATORY_CARE_PROVIDER_SITE_OTHER): Payer: Self-pay | Admitting: Nurse Practitioner

## 2019-04-08 ENCOUNTER — Ambulatory Visit (INDEPENDENT_AMBULATORY_CARE_PROVIDER_SITE_OTHER): Payer: Medicare Other | Admitting: Nurse Practitioner

## 2019-04-08 ENCOUNTER — Other Ambulatory Visit: Payer: Self-pay

## 2019-04-08 VITALS — BP 147/72 | HR 88 | Temp 98.0°F | Ht 65.0 in | Wt 136.5 lb

## 2019-04-08 DIAGNOSIS — D649 Anemia, unspecified: Secondary | ICD-10-CM

## 2019-04-08 DIAGNOSIS — R197 Diarrhea, unspecified: Secondary | ICD-10-CM | POA: Diagnosis not present

## 2019-04-08 DIAGNOSIS — R1110 Vomiting, unspecified: Secondary | ICD-10-CM

## 2019-04-08 NOTE — Patient Instructions (Signed)
1. Complete the ordered blood and stools tests   2. Imodium 1 tab twice daily. Add Pepto bismal 2 tab by mouth twice daily. Reduce Pepto and Imodium if your stools become hard. Stop Pepto and Imodium if you do not pass a bowel movement in 24 hours.  3. Take Lactaid  1 to 2 tabs with every dairy product  4. Florastor probiotic one capsule by mouth twice daily for 4 weeks. Ok to continue your current bacteria probiotic once daily.  5. Call our office if your vomiting or diarrhea worsens  6. Follow up in our office in 4 to 6 weeks, if your symptoms continue an EGD and colonoscopy will be ordered. If you continue to vomit an abdomina/pelvic CT will be ordered.

## 2019-04-09 ENCOUNTER — Other Ambulatory Visit: Payer: Self-pay | Admitting: *Deleted

## 2019-04-09 DIAGNOSIS — Z20822 Contact with and (suspected) exposure to covid-19: Secondary | ICD-10-CM

## 2019-04-10 ENCOUNTER — Encounter (INDEPENDENT_AMBULATORY_CARE_PROVIDER_SITE_OTHER): Payer: Self-pay

## 2019-04-10 LAB — NOVEL CORONAVIRUS, NAA: SARS-CoV-2, NAA: NOT DETECTED

## 2019-04-11 ENCOUNTER — Telehealth (INDEPENDENT_AMBULATORY_CARE_PROVIDER_SITE_OTHER): Payer: Self-pay | Admitting: *Deleted

## 2019-04-11 NOTE — Telephone Encounter (Signed)
This is from [facility] message: Sheri Reid has started passing some bright red blood in her stool and we wanting you to be aware of that. Could just be irritation but felt it necessary to notify you. Should we be concerned at all?

## 2019-04-11 NOTE — Telephone Encounter (Signed)
I called the provided home number, voicemail was not set up.  I called the provided mobile phone number and I left a message asking the patient to schedule follow-up appointment if she continues to have rectal bleeding.  If she is having minor blood on the toilet tissue if she can use Desitin anal cream for hemorrhoidal care, I provided instructions with this message.  If her symptoms are worse as the office is closed for the next 3 days I recommended that she call the answering service.

## 2019-04-12 LAB — COMPLETE METABOLIC PANEL WITH GFR
AG Ratio: 1.4 (calc) (ref 1.0–2.5)
ALT: 8 U/L (ref 6–29)
AST: 18 U/L (ref 10–35)
Albumin: 4.1 g/dL (ref 3.6–5.1)
Alkaline phosphatase (APISO): 64 U/L (ref 37–153)
BUN: 12 mg/dL (ref 7–25)
CO2: 22 mmol/L (ref 20–32)
Calcium: 9.4 mg/dL (ref 8.6–10.4)
Chloride: 103 mmol/L (ref 98–110)
Creat: 0.81 mg/dL (ref 0.60–0.88)
GFR, Est African American: 79 mL/min/{1.73_m2} (ref >=60)
GFR, Est Non African American: 69 mL/min/{1.73_m2} (ref >=60)
Globulin: 2.9 g/dL (calc) (ref 1.9–3.7)
Glucose, Bld: 186 mg/dL — ABNORMAL HIGH (ref 65–139)
Potassium: 4.1 mmol/L (ref 3.5–5.3)
Sodium: 139 mmol/L (ref 135–146)
Total Bilirubin: 0.3 mg/dL (ref 0.2–1.2)
Total Protein: 7 g/dL (ref 6.1–8.1)

## 2019-04-12 LAB — IRON,TIBC AND FERRITIN PANEL
%SAT: 13 % (calc) — ABNORMAL LOW (ref 16–45)
Ferritin: 22 ng/mL (ref 16–288)
Iron: 32 ug/dL — ABNORMAL LOW (ref 45–160)
TIBC: 256 mcg/dL (calc) (ref 250–450)

## 2019-04-12 LAB — CELIAC DISEASE PANEL
(tTG) Ab, IgA: <1 U/mL
(tTG) Ab, IgG: 3 U/mL
Gliadin IgA: 4 Units
Gliadin IgG: 2 Units
Immunoglobulin A: 256 mg/dL (ref 70–320)

## 2019-04-12 LAB — CBC WITH DIFFERENTIAL/PLATELET
Absolute Monocytes: 598 cells/uL (ref 200–950)
Basophils Absolute: 50 cells/uL (ref 0–200)
Basophils Relative: 0.6 %
Eosinophils Absolute: 100 cells/uL (ref 15–500)
Eosinophils Relative: 1.2 %
HCT: 38.8 % (ref 35.0–45.0)
Hemoglobin: 12.7 g/dL (ref 11.7–15.5)
Lymphs Abs: 1710 cells/uL (ref 850–3900)
MCH: 30.8 pg (ref 27.0–33.0)
MCHC: 32.7 g/dL (ref 32.0–36.0)
MCV: 94.2 fL (ref 80.0–100.0)
MPV: 10 fL (ref 7.5–12.5)
Monocytes Relative: 7.2 %
Neutro Abs: 5843 cells/uL (ref 1500–7800)
Neutrophils Relative %: 70.4 %
Platelets: 372 10*3/uL (ref 140–400)
RBC: 4.12 10*6/uL (ref 3.80–5.10)
RDW: 14.7 % (ref 11.0–15.0)
Total Lymphocyte: 20.6 %
WBC: 8.3 10*3/uL (ref 3.8–10.8)

## 2019-04-12 LAB — C-REACTIVE PROTEIN: CRP: 6.8 mg/L (ref <8.0)

## 2019-04-17 ENCOUNTER — Telehealth: Payer: Self-pay | Admitting: Nurse Practitioner

## 2019-04-17 LAB — GASTROINTESTINAL PATHOGEN PANEL PCR
C. difficile Tox A/B, PCR: NOT DETECTED
Campylobacter, PCR: NOT DETECTED
Cryptosporidium, PCR: NOT DETECTED
E coli (ETEC) LT/ST PCR: NOT DETECTED
E coli (STEC) stx1/stx2, PCR: NOT DETECTED
E coli 0157, PCR: NOT DETECTED
Giardia lamblia, PCR: NOT DETECTED
Norovirus, PCR: NOT DETECTED
Rotavirus A, PCR: NOT DETECTED
Salmonella, PCR: NOT DETECTED
Shigella, PCR: NOT DETECTED

## 2019-04-17 LAB — PANCREATIC ELASTASE, FECAL: Pancreatic Elastase-1, Stool: 222 mcg/g

## 2019-04-17 NOTE — Telephone Encounter (Signed)
[name] can you please add the patient to my schedule at 10:30 AM on 04/25/2019 if this spot is still open.  She needs an earlier appointment.  If there is an earlier cancellation with me or Dr. Dorien Chihuahua schedule her ASAP.  Thank you

## 2019-04-23 ENCOUNTER — Encounter (INDEPENDENT_AMBULATORY_CARE_PROVIDER_SITE_OTHER): Payer: Self-pay

## 2019-04-24 ENCOUNTER — Encounter (INDEPENDENT_AMBULATORY_CARE_PROVIDER_SITE_OTHER): Payer: Self-pay

## 2019-04-24 ENCOUNTER — Telehealth (INDEPENDENT_AMBULATORY_CARE_PROVIDER_SITE_OTHER): Payer: Self-pay | Admitting: *Deleted

## 2019-04-24 NOTE — Telephone Encounter (Signed)
[name], [name], NP 22 hours ago (12:54 PM)   This message from 04/23/2019.   Would it be possible to reorder aprizo for my mother. She should be taking it and apparently it was discontinued without our knowledge.   [name], [name] Clinical Pool  Phone Number: [phone]        [name],  Mom apparently has a terrible yeast infection. Can we get something ordered for that as well? I am assuming you got my message about the Aprezo. I really think we need to start that back. One of her physicians stopped it while she was in [facility] for surgery and i believe they forgot to order it when she went home and we did not catch it. She has a history of ulcerative colitis.   Please have the office change her contact information to Sheri Reid @ [phone]. She cannot keep up with scheduling appointments. Thank you!    THIS MESSAGE IS FROM TODAY

## 2019-04-25 ENCOUNTER — Ambulatory Visit (INDEPENDENT_AMBULATORY_CARE_PROVIDER_SITE_OTHER): Payer: Medicare Other | Admitting: Nurse Practitioner

## 2019-04-25 NOTE — Telephone Encounter (Signed)
I will forward this message to [name] as she makes the appointments.

## 2019-04-25 NOTE — Telephone Encounter (Signed)
Sheri Reid, I spoke to the patient, she stated she and her son forgot to mention that she was diagnosed with ulcerative colitis 10 years ago and she was placed on Apriso 4 tabs then 2 tabs and her diarrhea resolved. She is 36 and she isn't sure she wants to have another colonoscopy but she is tired of having diarrhea. She wants an appt to see Dr .Laural Golden, can you schedule her for an appt with him soon? thx

## 2019-04-29 ENCOUNTER — Other Ambulatory Visit: Payer: Self-pay

## 2019-04-29 ENCOUNTER — Encounter (INDEPENDENT_AMBULATORY_CARE_PROVIDER_SITE_OTHER): Payer: Self-pay | Admitting: Internal Medicine

## 2019-04-29 ENCOUNTER — Ambulatory Visit (INDEPENDENT_AMBULATORY_CARE_PROVIDER_SITE_OTHER): Payer: Medicare Other | Admitting: Internal Medicine

## 2019-04-29 VITALS — BP 152/73 | HR 91 | Temp 98.1°F | Ht 65.0 in | Wt 134.4 lb

## 2019-04-29 DIAGNOSIS — K529 Noninfective gastroenteritis and colitis, unspecified: Secondary | ICD-10-CM | POA: Diagnosis not present

## 2019-04-29 DIAGNOSIS — K5100 Ulcerative (chronic) pancolitis without complications: Secondary | ICD-10-CM | POA: Diagnosis not present

## 2019-04-29 MED ORDER — DICYCLOMINE HCL 20 MG PO TABS: 10.0000 mg | ORAL_TABLET | Freq: Three times a day (TID) | ORAL | 0 refills | Status: DC

## 2019-04-29 MED ORDER — MESALAMINE ER 0.375 G PO CP24: 1500.0000 mg | ORAL_CAPSULE | Freq: Every day | ORAL | 11 refills | Status: DC

## 2019-04-29 NOTE — Patient Instructions (Signed)
Keep stool diary as to frequency and consistency of stools every day if possible or at least 2 weeks prior to next visit. Can use Dulcolax suppository if you have an urge and unable to have a bowel movement. Try to have a bowel movement after each meal. Please notify if you become constipated.

## 2019-04-29 NOTE — Progress Notes (Signed)
Presenting complaint;  Follow-up for diarrhea.  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 She was initially seen in our office on 04/08/2019 by Ms. Berniece Pap, NP for evaluation of diarrhea which started after she had lumbar spine surgery on 10/31/2018 at [facility]. Patient's son Sheri Reid recalls that her [name] was not continued for several days or weeks and nobody realized that.  Back in August 2020 stool test was positive for Campylobacter and she was treated with Cipro initially for 3 days but without symptomatic improvement and subsequently for 7 days in combination with metronidazole but once again she did not improve and then treated with azithromycin and Xifaxan but without symptomatic improvement. She reported having diarrhea despite taking loperamide OTC 2 mg twice daily.  Following her last visit to our office she had following tests.  CBC was normal and her EO count was 1.2%. CRP was 6.8(normal up to 8). Comprehensive chemistry panel was normal except blood glucose of 186.  Serum potassium was 4.1 and albumin was 4.1 as well. Serum iron was 32 TIBC 256 and saturation was 13% and ferritin 22. Celiac disease panel was negative. GI pathogen panel was negative. Fecal pancreatic elastase was 222(normal greater than 200 mcg/g  Patient was advised to continue loperamide 2 mg twice daily and begin Pepto-Bismol tablets to 3 times a day.  Patient reports feeling better.  She is having an average of 2-3 stools per day.  Since her last visit she has had 5 or 6 accidents.  On [place] stool chart her stool is either 4 or 5.  She is able to sleep during the night.  She wakes up around 4:00 usually to void and she may  also have a bowel movement.  She continues to pass large amount of flatus.  At times she has an urge but unable to have a bowel movement.  Her appetite is not good.  However she has not lost any weight since her last visit.  She denies abdominal pain or cramping. Her son Sheri Reid states that she has sporadic vomiting.  This generally occurs after lunch may be after she is eating 50%.  She would just suddenly throw up.  He states she has had 2 or 3 episodes since her last visit. She was seen by her neurologist last month and begun on Sinemet. Patient has not been able to walk since February 2019.  Her balance is terrible.  At home she is able to walk some only with help when she has to go to the bathroom.  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 Carol Ada.  There is only one picture in the report revealing dilated colon covered with stool.  As best as could be determined mucosa looks normal.   Current Medications: Outpatient Encounter Medications as of 04/29/2019  Medication Sig  . acetaminophen (TYLENOL) 500 MG tablet Take 500-1,000 mg by mouth 2 (two) times daily as needed for moderate pain.  . Ascorbic Acid (VITAMIN C) 1000 MG tablet Take 1,000 mg by mouth daily.  . Biotin (BIOTIN 5000) 5 MG CAPS Take 5,000 mg by mouth daily.  . carbidopa-levodopa (SINEMET  IR) 25-100 MG tablet Take 1 tablet by mouth 3 (three) times daily.  . cholecalciferol (VITAMIN D3) 25 MCG (1000 UT) tablet Take 2,000 Units by mouth daily.  . Coenzyme Q10 (CO Q-10) 200 MG CAPS Take 200 mg by mouth daily.  . Cranberry 500 MG CAPS Take 500 mg by mouth daily.  Marland Kitchen dicyclomine (BENTYL) 20 MG tablet Take 0.5 tablets (10 mg total) by mouth 3 (three) times daily before meals.  . docusate sodium (COLACE) 100 MG capsule Take 1 capsule (100 mg total) by mouth 2 (two) times daily.  Marland Kitchen gabapentin (NEURONTIN) 100 MG  capsule Take 1 capsule (100 mg total) by mouth 2 (two) times daily.  Marland Kitchen LANTUS SOLOSTAR 100 UNIT/ML Solostar Pen Inject 25 Units into the skin at bedtime.  Marland Kitchen levocetirizine (XYZAL) 5 MG tablet Take 5 mg by mouth daily.  Marland Kitchen lisinopril (PRINIVIL,ZESTRIL) 5 MG tablet Take 10 mg by mouth daily.  . metFORMIN (GLUCOPHAGE) 1000 MG tablet Take 1,000 mg by mouth 2 (two) times daily with a meal.   . Multiple Vitamin (MULTIVITAMIN) tablet Take 1 tablet by mouth daily.  Marland Kitchen NOVOLOG FLEXPEN 100 UNIT/ML FlexPen Inject 1-2 Units into the skin daily as needed for high blood sugar.   . omeprazole (PRILOSEC) 20 MG capsule Take 20 mg by mouth daily.  . primidone (MYSOLINE) 50 MG tablet Take 1 tablet by mouth daily.  . Probiotic Product (PROBIOTIC DAILY PO) Take 3,000 mcg by mouth daily.  Marland Kitchen tiZANidine (ZANAFLEX) 2 MG tablet Take 1 mg by mouth at bedtime.  . traMADol (ULTRAM) 50 MG tablet Take 1 tablet (50 mg total) by mouth every 6 (six) hours as needed for moderate pain.  . Turmeric 500 MG CAPS Take 1,000 mg by mouth daily.  . vitamin B-12 (CYANOCOBALAMIN) 1000 MCG tablet Take 3,000 mcg by mouth daily.  . [DISCONTINUED] dicyclomine (BENTYL) 20 MG tablet Take 1 tablet (20 mg total) by mouth 3 (three) times daily before meals.  . ergocalciferol (VITAMIN D2) 50000 units capsule Take 50,000 Units by mouth every 14 (fourteen) days.  . [DISCONTINUED] VICTOZA 18 MG/3ML SOPN Inject 1.8 mg into the skin every morning.    No facility-administered encounter medications on file as of 04/29/2019.      Objective: Blood pressure (!) 152/73, pulse 91, temperature 98.1 F (36.7 C), temperature source Oral, height _0  (1.651 m), weight 134 lb 6.4 oz (61 kg). Patient is alert and in no acute distress. She is wearing a mask. Conjunctiva is pink. Sclera is nonicteric Oropharyngeal mucosa is normal. No neck masses or thyromegaly noted. Cardiac exam with regular rhythm normal S1 and S2. No murmur or gallop noted. Lungs are clear  to auscultation. Abdomen abdomen is full but not distended.  Bowel sounds are normal.  Percussion note is tympanitic in epigastric region.  On palpation abdomen is soft and nontender with organomegaly or masses. No LE edema or clubbing noted.  Labs/studies Results:   CBC Latest Ref Rng & Units 04/09/2019 08/19/2015 08/07/2015  WBC 3.8 - 10.8 Thousand/uL 8.3 9.8 9.4  Hemoglobin 11.7 - 15.5 g/dL 12.7 11.9(L) 14.5  Hematocrit 35.0 - 45.0 % 38.8 34.8(L) 42.5  Platelets 140 - 400 Thousand/uL 372 283 237    CMP Latest Ref Rng & Units 04/09/2019 08/25/2015 08/23/2015  Glucose 65 - 139 mg/dL 186(H) 87 180(H)  BUN 7 - 25 mg/dL _1 Creatinine 0.60 - 0.88 mg/dL 0.81 0.84 0.89  Sodium 135 - 146 mmol/L 139 138 137  Potassium  3.5 - 5.3 mmol/L 4.1 3.7 4.1  Chloride 98 - 110 mmol/L 103 103 100(L)  CO2 20 - 32 mmol/L _0 Calcium 8.6 - 10.4 mg/dL 9.4 9.4 8.7(L)  Total Protein 6.1 - 8.1 g/dL 7.0 - -  Total Bilirubin 0.2 - 1.2 mg/dL 0.3 - -  Alkaline Phos 38 - 126 U/L - - -  AST 10 - 35 U/L 18 - -  ALT 6 - 29 U/L 8 - -    Hepatic Function Latest Ref Rng & Units 04/09/2019 08/19/2015  Total Protein 6.1 - 8.1 g/dL 7.0 6.0(L)  Albumin 3.5 - 5.0 g/dL - 2.7(L)  AST 10 - 35 U/L 18 27  ALT 6 - 29 U/L 8 27  Alk Phosphatase 38 - 126 U/L - 46  Total Bilirubin 0.2 - 1.2 mg/dL 0.3 0.6    Lab Results  Component Value Date   CRP 6.8 04/09/2019      Assessment:  #1.  Chronic diarrhea.  She has history of ulcerative colitis but as best as could be determined she is in remission.  She is not having bloody diarrhea.  She has multiple reasons to have diarrhea.  She had Campylobacter infection 4 months ago treated with multiple antibiotics but without symptomatic improvement which makes me think her diarrhea may be due to dilated colon with impaired water absorption.  She seemed to have gotten a lot better with Pepto-Bismol and loperamide.  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  Therefore she needs to come off this medication.  We will do it in 2 steps. Since she is doing better with current therapy will hold off endoscopic evaluation.  #2.  History of ulcerative colitis.  She needs to be back on mesalamine as there is significant risk of relapse.  CRP is normal and that is reassuring.  Will consider fecal calprotectin at the time of next office visit.  #3.  She has low serum iron saturation and low normal serum ferritin but H&H is normal.  We will hold off adding p.o. iron at this time.  #4.  Intermittent vomiting.  If vomiting continues despite stopping dicyclomine will consider EGD.   Plan:  Continue loperamide OTC at 2 mg p.o. twice daily. Continue Pepto-Bismol tablets to 3 times a day. Patient advised to use Dulcolax suppository if she has an urge and unable to have bowel movement. Patient advised to use bathroom after every meal. Decrease dicyclomine to 10 mg 3 times a day.  Will plan to stop this medication at the time of next visit. Patient will call should she become constipated. Patient advised to keep stool diary for 2 weeks prior to her next visit in 2 months and if possible she can take pictures of her first stools few times with her smart phone. Begin Apriso at 1500 mg p.o. daily. Office visit in 2 months.

## 2019-04-30 ENCOUNTER — Encounter (INDEPENDENT_AMBULATORY_CARE_PROVIDER_SITE_OTHER): Payer: Self-pay

## 2019-05-22 ENCOUNTER — Ambulatory Visit (INDEPENDENT_AMBULATORY_CARE_PROVIDER_SITE_OTHER): Payer: Medicare Other | Admitting: Nurse Practitioner

## 2019-05-22 ENCOUNTER — Other Ambulatory Visit (INDEPENDENT_AMBULATORY_CARE_PROVIDER_SITE_OTHER): Payer: Self-pay | Admitting: *Deleted

## 2019-06-12 ENCOUNTER — Encounter (INDEPENDENT_AMBULATORY_CARE_PROVIDER_SITE_OTHER): Payer: Self-pay

## 2019-06-18 ENCOUNTER — Encounter (INDEPENDENT_AMBULATORY_CARE_PROVIDER_SITE_OTHER): Payer: Self-pay

## 2019-06-20 ENCOUNTER — Encounter (INDEPENDENT_AMBULATORY_CARE_PROVIDER_SITE_OTHER): Payer: Self-pay | Admitting: Gastroenterology

## 2019-06-20 ENCOUNTER — Ambulatory Visit (INDEPENDENT_AMBULATORY_CARE_PROVIDER_SITE_OTHER): Payer: Medicare Other | Admitting: Gastroenterology

## 2019-06-20 ENCOUNTER — Other Ambulatory Visit: Payer: Self-pay

## 2019-06-20 VITALS — BP 146/72 | HR 80 | Temp 97.7°F | Ht 66.0 in | Wt 131.2 lb

## 2019-06-20 DIAGNOSIS — D509 Iron deficiency anemia, unspecified: Secondary | ICD-10-CM | POA: Diagnosis not present

## 2019-06-20 DIAGNOSIS — K625 Hemorrhage of anus and rectum: Secondary | ICD-10-CM | POA: Diagnosis not present

## 2019-06-20 NOTE — Patient Instructions (Signed)
Important to contact me if rectal bleeding returns.  Continue NSAID products including Advil.  We are checking hemoglobin and iron labs today-we will call next week with results return

## 2019-06-20 NOTE — Progress Notes (Addendum)
Patient profile: Sheri Reid is a 79 y.o. female seen for evaluation of rectal bleeding. Last seen in clinic November 2020 by Dr. Laural Golden  History of Present Illness: Sheri Reid is seen today for evaluation of rectal bleeding, states that this began about 3 weeks ago was having bright red blood with bowel movements.  It became fairly significant for about 5 days.  She stopped taking Advil PM which she was taking 2 of nightly and the bleeding spontaneously resolved.  She did not have any issues with rectal pain with the bleeding.  She is having 2-4 bowel movements a day which is her baseline, these can be loose to formed consistency.  She does have some issues with leakage at time due to urgency.  She is on 2000 mg of Metformin daily.  She feels that her Apriso dose is working well.  She reports a good appetite and denies any nausea unless she overeats.  No GERD symptoms on low-dose Prilosec.  She denies any dysphagia.  She is working with physical therapy to regain her strength, biggest issue today seems to be fatigue.  She restarted her iron supplement recently in case iron level was low. Son accompanies to OV  Wt Readings from Last 3 Encounters:  06/20/19 131 lb 3.2 oz (59.5 kg)  04/29/19 134 lb 6.4 oz (61 kg)  04/08/19 136 lb 8 oz (61.9 kg)     Last Therapeutic Colonoscopy 08/21/2015 by Dr. Benson Norway at [facility]: - Dilated in the ascending colon and in the cecum. - Decompression was performed with suctioning of the luminal air.   -External palpation of the abdomen revealed a significant drop in her   distension and the patient reported an improvement. A decompression tube was not inserted as these   small caliber tubes clog shortly after deployment. - No specimens collected.  Colonoscopy 2012: report not found in [place].   Past Medical History:  Past Medical History:  Diagnosis Date  . Arthritis    "back" (08/13/2015)  . Chronic lower back pain   . Constipation due to pain medication     . Depression    with pain  . Diabetic peripheral neuropathy ([place])   . GERD (gastroesophageal reflux disease)   . Tremor of unknown origin    family has it  . Type II diabetes mellitus ([place])   . Ulcerative colitis ([place])     Problem List: Patient Active Problem List   Diagnosis Date Noted  . Chronic diarrhea 04/29/2019  . Vomiting 04/08/2019  . Ileus ([place])   . S/P spinal surgery 08/19/2015  . Nausea with vomiting 08/19/2015  . Diarrhea 08/19/2015  . Abdominal pain 08/19/2015  . Hypoglycemia 08/19/2015  . Diabetes mellitus with complication ([place]) 94/85/4627  . Ulcerative colitis ([place]) 08/19/2015  . Abdominal distension   . Nausea & vomiting   . Lumbar scoliosis 08/13/2015    Past Surgical History: Past Surgical History:  Procedure Laterality Date  . ANTERIOR LAT LUMBAR FUSION Left 08/13/2015   Procedure: Left Access L2-3 L3-4 L4-5 Anterolateral interbody fusion ;  Surgeon: Erline Levine, MD;  Location: [place] NEUR[facility] ORS;  Service: Neurosurgery;  Laterality: Left;  Left Access L2-3 L3-4 L4-5 Anterolateral interbody fusion   . ANTERIOR LATERAL LUMBAR FUSION WITH PERCUTANEOUS SCREW 3 LEVEL Left 08/13/2015   L2-3 L3-4 L4-5 Anterolateral interbody fusion with percutaneous pedicle screws   . BACK SURGERY    . CATARACT EXTRACTION W/ INTRAOCULAR LENS  IMPLANT, BILATERAL Bilateral   . COLONOSCOPY    .  COLONOSCOPY N/A 08/21/2015   Procedure: COLONOSCOPY;  Surgeon: Carol Ada, MD;  [facility] ENDOSCOPY;  Service: Endoscopy;  Laterality: N/A;  . JOINT REPLACEMENT    . LUMBAR PERCUTANEOUS PEDICLE SCREW 3 LEVEL N/A 08/13/2015   Procedure: L2-3 L3-4 L4-5 Anterolateral interbody fusion with percutaneous pedicle screws;  Surgeon: Erline Levine, MD;  Location: [place] NEURO ORS;  Service: Neurosurgery;  Laterality: N/A;  L2-3 L3-4 L4-5 Anterolateral interbody fusion with percutaneous pedicle screws  . 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 TOTAL SHOULDER ARTHROPLASTY Right 07/10/11    Allergies: No  Known Allergies    Home Medications:  Current Outpatient Medications:  .  acetaminophen (TYLENOL) 500 MG tablet, Take 500-1,000 mg by mouth 2 (two) times daily as needed for moderate pain., Disp: , Rfl:  .  Ascorbic Acid (VITAMIN C) 1000 MG tablet, Take 1,000 mg by mouth daily., Disp: , Rfl:  .  atorvastatin (LIPITOR) 20 MG tablet, Take 20 mg by mouth daily., Disp: , Rfl:  .  cholecalciferol (VITAMIN D3) 25 MCG (1000 UT) tablet, Take 2,000 Units by mouth daily., Disp: , Rfl:  .  Coenzyme Q10 (CO Q-10) 100 MG CAPS, Take 100 mg by mouth daily. , Disp: , Rfl:  .  Cranberry 450 MG TABS, Take 1 tablet by mouth every morning., Disp: , Rfl:  .  ergocalciferol (VITAMIN D2) 50000 units capsule, Take 50,000 Units by mouth every 14 (fourteen) days., Disp: , Rfl:  .  escitalopram (LEXAPRO) 10 MG tablet, Take 10 mg by mouth daily., Disp: , Rfl:  .  loperamide (IMODIUM A-D) 2 MG tablet, Take 2 mg by mouth 2 (two) times daily., Disp: , Rfl:  .  losartan (COZAAR) 50 MG tablet, Take 50 mg by mouth daily., Disp: , Rfl:  .  mesalamine (APRISO) 0.375 g 24 hr capsule, Take 4 capsules (1.5 g total) by mouth daily., Disp: 120 capsule, Rfl: 11 .  metFORMIN (GLUCOPHAGE) 1000 MG tablet, Take 1,000 mg by mouth 2 (two) times daily with a meal. , Disp: , Rfl:  .  Multiple Vitamin (MULTIVITAMIN) tablet, Take 1 tablet by mouth daily., Disp: , Rfl:  .  Multiple Vitamins-Minerals (PRESERVISION AREDS 2) CAPS, Take 1 capsule by mouth 2 (two) times daily., Disp: , Rfl:  .  NOVOLOG FLEXPEN 100 UNIT/ML FlexPen, Inject 1-2 Units into the skin daily as needed for high blood sugar. , Disp: , Rfl:  .  omeprazole (PRILOSEC) 20 MG capsule, Take 20 mg by mouth daily., Disp: , Rfl:  .  OZEMPIC, 0.25 OR 0.5 MG/DOSE, 2 MG/1.5ML SOPN, once a week. Saturday, Disp: , Rfl:  .  primidone (MYSOLINE) 50 MG tablet, Take 1 tablet by mouth daily., Disp: , Rfl:  .  Probiotic Product (PROBIOTIC DAILY PO), Take 3,000 mcg by mouth daily., Disp: , Rfl:    .  TRESIBA FLEXTOUCH 100 UNIT/ML SOPN FlexTouch Pen, at bedtime. BS - High 20 units BS not real high she will take 10-20 units, Disp: , Rfl:  .  Turmeric 500 MG CAPS, Take 1,000 mg by mouth daily., Disp: , Rfl:  .  vitamin B-12 (CYANOCOBALAMIN) 1000 MCG tablet, Take 3,000 mcg by mouth daily., Disp: , Rfl:  .  Biotin (BIOTIN 5000) 5 MG CAPS, Take 5,000 mg by mouth daily., Disp: , Rfl:  .  Bismuth Subsalicylate ([employer] BISMUTH) 262 MG TABS, Take 262 mg by mouth 3 (three) times daily., Disp: , Rfl:  .  carbidopa-levodopa (SINEMET IR) 25-100 MG tablet, Take 1 tablet by mouth  3 (three) times daily., Disp: , Rfl:  .  dicyclomine (BENTYL) 20 MG tablet, Take 0.5 tablets (10 mg total) by mouth 3 (three) times daily before meals. (Patient not taking: Reported on 06/20/2019), Disp: 45 tablet, Rfl: 0 .  docusate sodium (COLACE) 100 MG capsule, Take 1 capsule (100 mg total) by mouth 2 (two) times daily. (Patient not taking: Reported on 06/20/2019), Disp: 10 capsule, Rfl: 0 .  LANTUS SOLOSTAR 100 UNIT/ML Solostar Pen, Inject 25 Units into the skin at bedtime. (Patient not taking: Reported on 06/20/2019), Disp: 15 mL, Rfl: 11 .  levocetirizine (XYZAL) 5 MG tablet, Take 5 mg by mouth daily., Disp: , Rfl:  .  lisinopril (PRINIVIL,ZESTRIL) 5 MG tablet, Take 10 mg by mouth daily., Disp: , Rfl:  .  tiZANidine (ZANAFLEX) 2 MG tablet, Take 1 mg by mouth at bedtime., Disp: , Rfl:  .  traMADol (ULTRAM) 50 MG tablet, Take 1 tablet (50 mg total) by mouth every 6 (six) hours as needed for moderate pain. (Patient not taking: Reported on 06/20/2019), Disp: 30 tablet, Rfl: 0    Family History: Mother deceased, history of hypertension.  Father deceased with history of diabetes and hypertension.   Social History:   reports that she has quit smoking. Her smoking use included cigarettes. She has a 45.00 pack-year smoking history. She has never used smokeless tobacco. She reports current alcohol use of about 2.0 standard drinks of alcohol  per week. She reports that she does not use drugs.   Review of Systems: Constitutional: Denies weight loss/weight gain  Eyes: No changes in vision. ENT: No oral lesions, sore throat.  GI: see HPI.  Heme/Lymph: No easy bruising.  CV: No chest pain.  GU: No hematuria.  Integumentary: No rashes.  Neuro: No headaches.  Psych: No depression/anxiety.  Endocrine: No heat/cold intolerance.  Allergic/Immunologic: No urticaria.  Resp: No cough, SOB.  Musculoskeletal: No joint swelling.    Physical Examination: BP (!) 146/72 (BP Location: Left Arm, Patient Position: Sitting, Cuff Size: Small)   Pulse 80   Temp 97.7 F (36.5 C) (Oral)   Ht 5' 6"  (1.676 m)   Wt 131 lb 3.2 oz (59.5 kg)   BMI 21.18 kg/m  Gen: NAD, alert and oriented x 4. In wheelchair.  HEENT: PEERLA, EOMI, Neck: supple, no JVD Chest: CTA bilaterally, no wheezes, crackles, or other adventitious sounds CV: RRR, no m/g/c/r Abd: soft, NT, ND, +BS in all four quadrants; no HSM, guarding, ridigity, or rebound tenderness Ext: no edema, well perfused with 2+ pulses, Skin: no rash or lesions noted on observed skin Lymph: no noted LAD  Data Reviewed:  03/2019-CBC with hemoglobin 12.7, MCV 94, celiac panel negative, CRP normal, CMP with glucose 186, iron 32, ferritin 22, negative GI PCR.  Normal pancreatic elastase  Assessment/Plan: Ms. Holcomb is a 79 y.o. female    [place] was seen today for follow-up.  Diagnoses and all orders for this visit:  Rectal bleeding -     CBC with Differential -     Fe+TIBC+Fer  Iron deficiency anemia, unspecified iron deficiency anemia type -     CBC with Differential -     Fe+TIBC+Fer    1.  Rectal bleeding x2 to 3 weeks that resolved with stopping her Advil PM.  She has not had change in bowel consistency or flare like Sx significantly so lower suspicion for flare of ulcerative colitis.  She has not seen any blood in over a week.  She will continue her  Apriso. Will check CBC for  completeness. Restarted iron supplement when began bleeding, check iron studies. States she had not been able to tolerate iron in past d/t GI SE but is tolerating okay now.    Given symptoms resolved will hold off on endoscopic eval-consider if rectal bleeding returns. Family in agreement w/ plan.    I personally performed the service, non-incident to. (WP)  Laurine Blazer, [facility]

## 2019-06-21 LAB — CBC WITH DIFFERENTIAL/PLATELET
Absolute Monocytes: 574 cells/uL (ref 200–950)
Basophils Absolute: 57 cells/uL (ref 0–200)
Basophils Relative: 0.7 %
Eosinophils Absolute: 590 cells/uL — ABNORMAL HIGH (ref 15–500)
Eosinophils Relative: 7.2 %
HCT: 37.9 % (ref 35.0–45.0)
Hemoglobin: 12.4 g/dL (ref 11.7–15.5)
Lymphs Abs: 2116 cells/uL (ref 850–3900)
MCH: 29.5 pg (ref 27.0–33.0)
MCHC: 32.7 g/dL (ref 32.0–36.0)
MCV: 90 fL (ref 80.0–100.0)
MPV: 8.9 fL (ref 7.5–12.5)
Monocytes Relative: 7 %
Neutro Abs: 4863 cells/uL (ref 1500–7800)
Neutrophils Relative %: 59.3 %
Platelets: 378 10*3/uL (ref 140–400)
RBC: 4.21 10*6/uL (ref 3.80–5.10)
RDW: 14.1 % (ref 11.0–15.0)
Total Lymphocyte: 25.8 %
WBC: 8.2 10*3/uL (ref 3.8–10.8)

## 2019-06-21 LAB — IRON,TIBC AND FERRITIN PANEL
%SAT: 10 % (calc) — ABNORMAL LOW (ref 16–45)
Ferritin: 24 ng/mL (ref 16–288)
Iron: 23 ug/dL — ABNORMAL LOW (ref 45–160)
TIBC: 241 mcg/dL (calc) — ABNORMAL LOW (ref 250–450)

## 2019-07-29 ENCOUNTER — Telehealth (INDEPENDENT_AMBULATORY_CARE_PROVIDER_SITE_OTHER): Payer: Self-pay | Admitting: Internal Medicine

## 2019-07-29 NOTE — Telephone Encounter (Signed)
Patient's lab studies reviewed from 07/16/2019 from Dr. Layne Benton office Hemoglobin is 12.5 and serum iron is up to 54. Glucose is elevated.  Patient is diabetic.  Review of long list of medications reveals that she is not taking Apriso anymore.  She is on sulfasalazine 1 g 3 times a day.  I called and talked with patient's son Cailee Blanke to confirm this.  He told me that she is on sulfasalazine and not Apriso. I asked that she take folic acid 1 mg every day.  He told me patient is doing fine and would like to cancel appointment on 08/01/2019 and keep appointment in May 2021.

## 2019-08-01 ENCOUNTER — Ambulatory Visit (INDEPENDENT_AMBULATORY_CARE_PROVIDER_SITE_OTHER): Payer: Medicare Other | Admitting: Internal Medicine

## 2019-08-12 ENCOUNTER — Ambulatory Visit (INDEPENDENT_AMBULATORY_CARE_PROVIDER_SITE_OTHER): Payer: Medicare Other | Admitting: Internal Medicine

## 2019-10-15 ENCOUNTER — Ambulatory Visit (INDEPENDENT_AMBULATORY_CARE_PROVIDER_SITE_OTHER): Payer: Medicare Other | Admitting: Internal Medicine

## 2019-11-26 ENCOUNTER — Ambulatory Visit (INDEPENDENT_AMBULATORY_CARE_PROVIDER_SITE_OTHER): Payer: Medicare Other | Admitting: Internal Medicine

## 2019-12-12 ENCOUNTER — Telehealth (INDEPENDENT_AMBULATORY_CARE_PROVIDER_SITE_OTHER): Payer: Self-pay | Admitting: Internal Medicine

## 2019-12-12 NOTE — Telephone Encounter (Signed)
You last saw this patient in January 2021. Patient states that the medication she was given  is not helping with her diarrhea. Contact Number is  [phone].

## 2019-12-12 NOTE — Telephone Encounter (Signed)
Patient has left message on voice mail stating all of the medication she has been given has not helped with her diarrhea - please advise - [phone]

## 2019-12-13 NOTE — Telephone Encounter (Signed)
Since patient was last seen 6 months I would recommend an office visit - may need stool studies, etc. Thanks.

## 2019-12-17 NOTE — Telephone Encounter (Signed)
Per Thayer Headings the patient needs to have a OV as she may need further testing,example stool. Please make appointment with Thayer Headings.

## 2019-12-23 ENCOUNTER — Other Ambulatory Visit (INDEPENDENT_AMBULATORY_CARE_PROVIDER_SITE_OTHER): Payer: Self-pay | Admitting: *Deleted

## 2019-12-23 ENCOUNTER — Other Ambulatory Visit: Payer: Self-pay

## 2019-12-23 ENCOUNTER — Encounter (INDEPENDENT_AMBULATORY_CARE_PROVIDER_SITE_OTHER): Payer: Self-pay | Admitting: Gastroenterology

## 2019-12-23 ENCOUNTER — Ambulatory Visit (INDEPENDENT_AMBULATORY_CARE_PROVIDER_SITE_OTHER): Payer: Medicare Other | Admitting: Gastroenterology

## 2019-12-23 ENCOUNTER — Encounter (INDEPENDENT_AMBULATORY_CARE_PROVIDER_SITE_OTHER): Payer: Self-pay | Admitting: *Deleted

## 2019-12-23 VITALS — BP 154/73 | HR 93 | Temp 97.8°F | Ht 66.0 in | Wt 127.2 lb

## 2019-12-23 DIAGNOSIS — D509 Iron deficiency anemia, unspecified: Secondary | ICD-10-CM

## 2019-12-23 DIAGNOSIS — K5100 Ulcerative (chronic) pancolitis without complications: Secondary | ICD-10-CM

## 2019-12-23 DIAGNOSIS — K625 Hemorrhage of anus and rectum: Secondary | ICD-10-CM | POA: Diagnosis not present

## 2019-12-23 NOTE — Patient Instructions (Signed)
-  We are scheduling colonoscopy for evaluation  -Stool studies and labs for evaluation

## 2019-12-23 NOTE — Progress Notes (Signed)
Patient profile: Sheri Reid is a 79 y.o. female seen for evaluation of rectal bleeding. Last seen 06/2019 for rectal bleeding that had resolved w/ stopping Advil. She has PMHX of UC maintained on apriso. Hgb normal at 06/2019 visit but iron 23 and ferritin 24. Was recommended to start iron supplement.   History of Present Illness: Sheri Reid is seen today for diarrhea. Reports bowels can be variable (3-4BM some days but other days wont have a BM at all for one day). On days having 3-4 Bm/day usually loose consistency, is having some blood in most stools as well. She reports can have rectal bleeding even with passing gas at times, ongoing few months. She has restarted advil PM. No abd pain with the rectal bleeding or diarrhea.  Has tried to [employer] triggers of diarrhea without success.   Has been metformin years.  She denies new medications when the diarrhea started.  She tried iron but reports this caused nausea.   She denies chronic GERD symptoms unless over eats.  No nausea vomiting or dysphagia.  Compliant with apriso 4 capsules per day. No longer on sulfasalazine. No longer using bentyl.   Son accompanies and helps with history.  Wt Readings from Last 3 Encounters:  12/23/19 127 lb 3.2 oz (57.7 kg)  06/20/19 131 lb 3.2 oz (59.5 kg)  04/29/19 134 lb 6.4 oz (61 kg)      Last Therapeutic Colonoscopy 08/21/2015 by Dr. Benson Norway at [facility]: - Dilated in the ascending colon and in the cecum. -Decompression was performed with suctioning of the luminal air. -External palpation of the abdomen revealed a significant drop in her distension and the patient reported an improvement. A decompression tube was not inserted as these small caliber tubes clog shortly after deployment. - No specimens collected.    Past Medical History:  Past Medical History:  Diagnosis Date   Arthritis    "back" (08/13/2015)   Chronic lower back pain    Constipation due to pain medication     Depression    with pain   Diabetic peripheral neuropathy ([facility])    GERD (gastroesophageal reflux disease)    Tremor of unknown origin    family has it   Type II diabetes mellitus ([place])    Ulcerative colitis ([place])     Problem List: Patient Active Problem List   Diagnosis Date Noted   Chronic diarrhea 04/29/2019   Vomiting 04/08/2019   Ileus ([place])    S/P spinal surgery 08/19/2015   Nausea with vomiting 08/19/2015   Diarrhea 08/19/2015   Abdominal pain 08/19/2015   Hypoglycemia 08/19/2015   Diabetes mellitus with complication ([place]) 16/03/9603   Ulcerative colitis ([place]) 08/19/2015   Abdominal distension    Nausea & vomiting    Lumbar scoliosis 08/13/2015    Past Surgical History: Past Surgical History:  Procedure Laterality Date   ANTERIOR LAT LUMBAR FUSION Left 08/13/2015   Procedure: Left Access L2-3 L3-4 L4-5 Anterolateral interbody fusion ;  Surgeon: Erline Levine, MD;  Location: [facility];  Service: Neurosurgery;  Laterality: Left;  Left Access L2-3 L3-4 L4-5 Anterolateral interbody fusion    ANTERIOR LATERAL LUMBAR FUSION WITH PERCUTANEOUS SCREW 3 LEVEL Left 08/13/2015   L2-3 L3-4 L4-5 Anterolateral interbody fusion with percutaneous pedicle screws    BACK SURGERY     CATARACT EXTRACTION W/ INTRAOCULAR LENS  IMPLANT, BILATERAL Bilateral    COLONOSCOPY     COLONOSCOPY N/A 08/21/2015   Procedure: COLONOSCOPY;  Surgeon: Carol Ada, MD;  Location: [facility] ENDOSCOPY;  Service: Endoscopy;  Laterality: N/A;   JOINT REPLACEMENT     LUMBAR PERCUTANEOUS PEDICLE SCREW 3 LEVEL N/A 08/13/2015   Procedure: L2-3 L3-4 L4-5 Anterolateral interbody fusion with percutaneous pedicle screws;  Surgeon: Erline Levine, MD;  Location: [place] NEURO ORS;  Service: Neurosurgery;  Laterality: N/A;  L2-3 L3-4 L4-5 Anterolateral interbody fusion with percutaneous pedicle screws   TOTAL HIP ARTHROPLASTY Bilateral 2007-2009   TOTAL SHOULDER ARTHROPLASTY Right 07/10/11    Allergies: No  Known Allergies    Home Medications:  Current Outpatient Medications:    acetaminophen (TYLENOL) 500 MG tablet, Take 500-1,000 mg by mouth 2 (two) times daily as needed for moderate pain., Disp: , Rfl:    Ascorbic Acid (VITAMIN C) 1000 MG tablet, Take 1,000 mg by mouth daily., Disp: , Rfl:    atorvastatin (LIPITOR) 20 MG tablet, Take 20 mg by mouth daily., Disp: , Rfl:    Bismuth Subsalicylate ([employer] BISMUTH) 262 MG TABS, Take 262 mg by mouth 3 (three) times daily., Disp: , Rfl:    cholecalciferol (VITAMIN D3) 25 MCG (1000 UT) tablet, Take 2,000 Units by mouth daily., Disp: , Rfl:    Coenzyme Q10 (CO Q-10) 100 MG CAPS, Take 100 mg by mouth daily. , Disp: , Rfl:    Cranberry 450 MG TABS, Take 1 tablet by mouth every morning., Disp: , Rfl:    ergocalciferol (VITAMIN D2) 50000 units capsule, Take 50,000 Units by mouth every 14 (fourteen) days., Disp: , Rfl:    escitalopram (LEXAPRO) 10 MG tablet, Take 10 mg by mouth daily., Disp: , Rfl:    loperamide (IMODIUM A-D) 2 MG tablet, Take 2 mg by mouth 2 (two) times daily., Disp: , Rfl:    losartan (COZAAR) 50 MG tablet, Take 50 mg by mouth daily., Disp: , Rfl:    metFORMIN (GLUCOPHAGE) 1000 MG tablet, Take 1,000 mg by mouth 2 (two) times daily with a meal. , Disp: , Rfl:    Multiple Vitamin (MULTIVITAMIN) tablet, Take 1 tablet by mouth daily., Disp: , Rfl:    Multiple Vitamins-Minerals (PRESERVISION AREDS 2) CAPS, Take 1 capsule by mouth 2 (two) times daily., Disp: , Rfl:    omeprazole (PRILOSEC) 20 MG capsule, Take 20 mg by mouth daily., Disp: , Rfl:    OZEMPIC, 0.25 OR 0.5 MG/DOSE, 2 MG/1.5ML SOPN, once a week. Saturday, Disp: , Rfl:    primidone (MYSOLINE) 50 MG tablet, Take 1 tablet by mouth daily., Disp: , Rfl:    Probiotic Product (PROBIOTIC DAILY PO), Take 3,000 mcg by mouth daily., Disp: , Rfl:    Turmeric 500 MG CAPS, Take 1,000 mg by mouth daily., Disp: , Rfl:    vitamin B-12 (CYANOCOBALAMIN) 1000 MCG tablet, Take 3,000  mcg by mouth daily., Disp: , Rfl:    Biotin (BIOTIN 5000) 5 MG CAPS, Take 5,000 mg by mouth daily. (Patient not taking: Reported on 12/23/2019), Disp: , Rfl:    carbidopa-levodopa (SINEMET IR) 25-100 MG tablet, Take 1 tablet by mouth 3 (three) times daily. (Patient not taking: Reported on 12/23/2019), Disp: , Rfl:    dicyclomine (BENTYL) 20 MG tablet, Take 0.5 tablets (10 mg total) by mouth 3 (three) times daily before meals. (Patient not taking: Reported on 06/20/2019), Disp: 45 tablet, Rfl: 0   docusate sodium (COLACE) 100 MG capsule, Take 1 capsule (100 mg total) by mouth 2 (two) times daily. (Patient not taking: Reported on 06/20/2019), Disp: 10 capsule, Rfl: 0   LANTUS SOLOSTAR 100 UNIT/ML Solostar Pen,  Inject 25 Units into the skin at bedtime. (Patient not taking: Reported on 06/20/2019), Disp: 15 mL, Rfl: 11   levocetirizine (XYZAL) 5 MG tablet, Take 5 mg by mouth daily. (Patient not taking: Reported on 12/23/2019), Disp: , Rfl:    lisinopril (PRINIVIL,ZESTRIL) 5 MG tablet, Take 10 mg by mouth daily. (Patient not taking: Reported on 12/23/2019), Disp: , Rfl:    NOVOLOG FLEXPEN 100 UNIT/ML FlexPen, Inject 1-2 Units into the skin daily as needed for high blood sugar.  (Patient not taking: Reported on 12/23/2019), Disp: , Rfl:    sulfaSALAzine (AZULFIDINE) 500 MG tablet, Take 2 tablets (1,000 mg total) by mouth 3 (three) times daily. (Patient not taking: Reported on 12/23/2019), Disp: , Rfl:    tiZANidine (ZANAFLEX) 2 MG tablet, Take 1 mg by mouth at bedtime. (Patient not taking: Reported on 12/23/2019), Disp: , Rfl:    traMADol (ULTRAM) 50 MG tablet, Take 1 tablet (50 mg total) by mouth every 6 (six) hours as needed for moderate pain. (Patient not taking: Reported on 06/20/2019), Disp: 30 tablet, Rfl: 0   TRESIBA FLEXTOUCH 100 UNIT/ML SOPN FlexTouch Pen, at bedtime. BS - High 20 units BS not real high she will take 10-20 units (Patient not taking: Reported on 12/23/2019), Disp: , Rfl:    Family  History: family history is not on file.    Social History:   reports that she has quit smoking. Her smoking use included cigarettes. She has a 45.00 pack-year smoking history. She has never used smokeless tobacco. She reports current alcohol use of about 2.0 standard drinks of alcohol per week. She reports that she does not use drugs.   Review of Systems: Constitutional: Denies weight loss/weight gain  Eyes: No changes in vision. ENT: No oral lesions, sore throat.  GI: see HPI.  Heme/Lymph: No easy bruising.  CV: No chest pain.  GU: No hematuria.  Integumentary: No rashes.  Neuro: No headaches.  Psych: No depression/anxiety.  Endocrine: No heat/cold intolerance.  Allergic/Immunologic: No urticaria.  Resp: No cough, SOB.  Musculoskeletal: No joint swelling.    Physical Examination: BP (!) 154/73 (BP Location: Right Arm, Patient Position: Sitting)    Pulse 93    Temp 97.8 F (36.6 C) (Oral)    Ht 5' 6"  (1.676 m)    Wt 127 lb 3.2 oz (57.7 kg)    BMI 20.53 kg/m  Gen: NAD, alert and oriented x 4 HEENT: PEERLA, EOMI, Neck: supple, no JVD Chest: CTA bilaterally, no wheezes, crackles, or other adventitious sounds CV: RRR, no m/g/c/r Abd: soft, NT, ND, +BS in all four quadrants; no HSM, guarding, ridigity, or rebound tenderness Ext: no edema, well perfused with 2+ pulses, Skin: no rash or lesions noted on observed skin Lymph: no noted LAD  Data Reviewed:  06/2019-Hgb 12.4, MCV 90 Iron 45 - 160 mcg/dL 23Low   TIBC 250 - 450 mcg/dL (calc) 241Low   %SAT 16 - 45 % (calc) 10Low   Ferritin 16 - 288 ng/mL 24     03/2019-fecal elastase normal, GI path panel normal.    Assessment/Plan: Ms. Romaniello is a 79 y.o. female with past medical history of ulcerative colitis on Apriso  1.  Diarrhea/rectal bleeding-intermittent diarrhea about half the days of the week with daily rectal bleeding.  Symptoms had improved in the past when she stopped NSAIDs but she has restarted.  Given last  colonoscopy was 4 years ago we will schedule colonoscopy to evaluate for endoscopic disease activity.  Also recheck her hemoglobin  and iron, she was not able to tolerate oral iron. Check stool studies to exclude infection.   Diagnoses and all orders for this visit:  Rectal bleeding -     CBC with Differential -     Fe+TIBC+Fer -     C-reactive protein -     Sed Rate (ESR) -     Gastrointestinal Pathogen Panel PCR -     C. difficile GDH and Toxin A/B  Ulcerative pancolitis without complication (HCC) -     Gastrointestinal Pathogen Panel PCR -     C. difficile GDH and Toxin A/B  Iron deficiency anemia, unspecified iron deficiency anemia type -     Gastrointestinal Pathogen Panel PCR -     C. difficile GDH and Toxin A/B     Patient denies CP, SOB, and use of blood thinners. I discussed the risks and benefits of procedure including bleeding, perforation, infection, missed lesions, medication reactions and possible hospitalization or surgery if complications. All questions answered.  She denies prior issues with sedation.  I personally performed the service, non-incident to. (WP)  Laurine Blazer, [facility]

## 2019-12-24 ENCOUNTER — Other Ambulatory Visit (INDEPENDENT_AMBULATORY_CARE_PROVIDER_SITE_OTHER): Payer: Self-pay | Admitting: *Deleted

## 2019-12-24 ENCOUNTER — Telehealth (INDEPENDENT_AMBULATORY_CARE_PROVIDER_SITE_OTHER): Payer: Self-pay | Admitting: Gastroenterology

## 2019-12-24 DIAGNOSIS — D509 Iron deficiency anemia, unspecified: Secondary | ICD-10-CM

## 2019-12-24 DIAGNOSIS — R197 Diarrhea, unspecified: Secondary | ICD-10-CM

## 2019-12-24 DIAGNOSIS — K5100 Ulcerative (chronic) pancolitis without complications: Secondary | ICD-10-CM

## 2019-12-24 DIAGNOSIS — D649 Anemia, unspecified: Secondary | ICD-10-CM

## 2019-12-24 DIAGNOSIS — K625 Hemorrhage of anus and rectum: Secondary | ICD-10-CM

## 2019-12-24 NOTE — Telephone Encounter (Signed)
The orders have been redone for [facility]. They have been faxed to the [facility] in [place]. [phone].

## 2019-12-24 NOTE — Telephone Encounter (Signed)
Patient left voice mail message stating she went to [facility] for lab work yesterday - was there for over an hour - decided to leave - would like lab orders to be faxed to [facility] in [place] - please advise - [phone]

## 2019-12-27 ENCOUNTER — Encounter (INDEPENDENT_AMBULATORY_CARE_PROVIDER_SITE_OTHER): Payer: Self-pay | Admitting: Gastroenterology

## 2019-12-27 NOTE — Addendum Note (Signed)
Addended by: Rogene Houston on: 12/27/2019 03:32 PM   Modules accepted: Orders

## 2020-01-16 ENCOUNTER — Ambulatory Visit (INDEPENDENT_AMBULATORY_CARE_PROVIDER_SITE_OTHER): Payer: Medicare Other | Admitting: Gastroenterology

## 2020-01-17 NOTE — Patient Instructions (Signed)
Your procedure is scheduled on: 01/22/2020  Report to [facility] at     13:25 PM.  Call this number if you have problems the morning of surgery: [phone]   Remember:              Follow Directions on the letter you received from Your Physician's office regarding the Bowel Prep              No Smoking the day of Procedure :   Take these medicines the morning of surgery with A SIP OF WATER: Lexapro and prilosec   Do not wear jewelry, make-up or nail polish.    Do not bring valuables to the hospital.  Contacts, dentures or bridgework may not be worn into surgery.  .   Patients discharged the day of surgery will not be allowed to drive home.     Colonoscopy, Adult, Care After This sheet gives you information about how to care for yourself after your procedure. Your health care provider may also give you more specific instructions. If you have problems or questions, contact your health care provider. What can I expect after the procedure? After the procedure, it is common to have:  A small amount of blood in your stool for 24 hours after the procedure.  Some gas.  Mild abdominal cramping or bloating.  Follow these instructions at home: General instructions   For the first 24 hours after the procedure: ? Do not drive or use machinery. ? Do not sign important documents. ? Do not drink alcohol. ? Do your regular daily activities at a slower pace than normal. ? Eat soft, easy-to-digest foods. ? Rest often.  Take over-the-counter or prescription medicines only as told by your health care provider.  It is up to you to get the results of your procedure. Ask your health care provider, or the department performing the procedure, when your results will be ready. Relieving cramping and bloating  Try walking around when you have cramps or feel bloated.  Apply heat to your abdomen as told by your health care provider. Use a heat source that your health care provider recommends,  such as a moist heat pack or a heating pad. ? Place a towel between your skin and the heat source. ? Leave the heat on for 20-30 minutes. ? Remove the heat if your skin turns bright red. This is especially important if you are unable to feel pain, heat, or cold. You may have a greater risk of getting burned. Eating and drinking  Drink enough fluid to keep your urine clear or pale yellow.  Resume your normal diet as instructed by your health care provider. Avoid heavy or fried foods that are hard to digest.  Avoid drinking alcohol for as long as instructed by your health care provider. Contact a health care provider if:  You have blood in your stool 2-3 days after the procedure. Get help right away if:  You have more than a small spotting of blood in your stool.  You pass large blood clots in your stool.  Your abdomen is swollen.  You have nausea or vomiting.  You have a fever.  You have increasing abdominal pain that is not relieved with medicine. This information is not intended to replace advice given to you by your health care provider. Make sure you discuss any questions you have with your health care provider. Document Released: 01/12/2004 Document Revised: 02/22/2016 Document Reviewed: 08/11/2015 Elsevier Interactive Patient Education  2018  [employer].

## 2020-01-20 ENCOUNTER — Encounter (HOSPITAL_COMMUNITY): Payer: Self-pay

## 2020-01-20 ENCOUNTER — Other Ambulatory Visit (HOSPITAL_COMMUNITY)
Admission: RE | Admit: 2020-01-20 | Discharge: 2020-01-20 | Disposition: A | Discharge status: Home / Self Care | Payer: Medicare Other | Source: Ambulatory Visit | Attending: Internal Medicine | Admitting: Internal Medicine

## 2020-01-20 ENCOUNTER — Other Ambulatory Visit: Payer: Self-pay

## 2020-01-20 ENCOUNTER — Encounter (HOSPITAL_COMMUNITY)
Admission: RE | Admit: 2020-01-20 | Discharge: 2020-01-20 | Disposition: A | Discharge status: Home / Self Care | Payer: Medicare Other | Source: Ambulatory Visit | Attending: Internal Medicine | Admitting: Internal Medicine

## 2020-01-20 DIAGNOSIS — Z01812 Encounter for preprocedural laboratory examination: Secondary | ICD-10-CM | POA: Insufficient documentation

## 2020-01-20 DIAGNOSIS — Z20822 Contact with and (suspected) exposure to covid-19: Secondary | ICD-10-CM | POA: Insufficient documentation

## 2020-01-20 DIAGNOSIS — D509 Iron deficiency anemia, unspecified: Secondary | ICD-10-CM | POA: Insufficient documentation

## 2020-01-20 DIAGNOSIS — Z01810 Encounter for preprocedural cardiovascular examination: Secondary | ICD-10-CM | POA: Insufficient documentation

## 2020-01-20 LAB — COMPREHENSIVE METABOLIC PANEL
ALT: 16 U/L (ref 0–44)
AST: 15 U/L (ref 15–41)
Albumin: 3.5 g/dL (ref 3.5–5.0)
Alkaline Phosphatase: 62 U/L (ref 38–126)
Anion gap: 10 (ref 5–15)
BUN: 18 mg/dL (ref 8–23)
CO2: 24 mmol/L (ref 22–32)
Calcium: 9.4 mg/dL (ref 8.9–10.3)
Chloride: 101 mmol/L (ref 98–111)
Creatinine, Ser: 0.7 mg/dL (ref 0.44–1.00)
GFR calc Af Amer: >60 mL/min (ref >60)
GFR calc non Af Amer: >60 mL/min (ref >60)
Glucose, Bld: 263 mg/dL — ABNORMAL HIGH (ref 70–99)
Potassium: 4.2 mmol/L (ref 3.5–5.1)
Sodium: 135 mmol/L (ref 135–145)
Total Bilirubin: 0.4 mg/dL (ref 0.3–1.2)
Total Protein: 7.3 g/dL (ref 6.5–8.1)

## 2020-01-20 LAB — C-REACTIVE PROTEIN: CRP: 1.8 mg/dL — ABNORMAL HIGH (ref <1.0)

## 2020-01-20 LAB — SARS CORONAVIRUS 2 (TAT 6-24 HRS): SARS Coronavirus 2: NEGATIVE

## 2020-01-20 LAB — CBC WITH DIFFERENTIAL/PLATELET
Abs Immature Granulocytes: 0.03 10*3/uL (ref 0.00–0.07)
Basophils Absolute: 0.1 10*3/uL (ref 0.0–0.1)
Basophils Relative: 1 %
Eosinophils Absolute: 0.2 10*3/uL (ref 0.0–0.5)
Eosinophils Relative: 3 %
HCT: 40 % (ref 36.0–46.0)
Hemoglobin: 12.5 g/dL (ref 12.0–15.0)
Immature Granulocytes: 0 %
Lymphocytes Relative: 27 %
Lymphs Abs: 2.1 10*3/uL (ref 0.7–4.0)
MCH: 28.2 pg (ref 26.0–34.0)
MCHC: 31.3 g/dL (ref 30.0–36.0)
MCV: 90.3 fL (ref 80.0–100.0)
Monocytes Absolute: 0.5 10*3/uL (ref 0.1–1.0)
Monocytes Relative: 6 %
Neutro Abs: 4.9 10*3/uL (ref 1.7–7.7)
Neutrophils Relative %: 63 %
Platelets: 412 10*3/uL — ABNORMAL HIGH (ref 150–400)
RBC: 4.43 MIL/uL (ref 3.87–5.11)
RDW: 13.4 % (ref 11.5–15.5)
WBC: 7.7 10*3/uL (ref 4.0–10.5)
nRBC: 0 % (ref 0.0–0.2)

## 2020-01-20 LAB — SEDIMENTATION RATE: Sed Rate: 32 mm/hr — ABNORMAL HIGH (ref 0–22)

## 2020-01-20 LAB — FERRITIN: Ferritin: 13 ng/mL (ref 11–307)

## 2020-01-20 LAB — IRON AND TIBC
Iron: 45 ug/dL (ref 28–170)
Saturation Ratios: 14 % (ref 10.4–31.8)
TIBC: 319 ug/dL (ref 250–450)
UIBC: 274 ug/dL

## 2020-01-22 ENCOUNTER — Ambulatory Visit (HOSPITAL_COMMUNITY): Payer: Medicare Other | Admitting: Anesthesiology

## 2020-01-22 ENCOUNTER — Ambulatory Visit (HOSPITAL_COMMUNITY)
Admission: RE | Admit: 2020-01-22 | Discharge: 2020-01-22 | Disposition: A | Discharge status: Home / Self Care | Payer: Medicare Other | Attending: Internal Medicine | Admitting: Internal Medicine

## 2020-01-22 ENCOUNTER — Encounter (HOSPITAL_COMMUNITY)
Admission: RE | Disposition: A | Discharge status: Home / Self Care | Payer: Self-pay | Source: Home / Self Care | Attending: Internal Medicine

## 2020-01-22 ENCOUNTER — Encounter (HOSPITAL_COMMUNITY): Payer: Self-pay | Admitting: Internal Medicine

## 2020-01-22 DIAGNOSIS — K51211 Ulcerative (chronic) proctitis with rectal bleeding: Secondary | ICD-10-CM

## 2020-01-22 DIAGNOSIS — K6289 Other specified diseases of anus and rectum: Secondary | ICD-10-CM | POA: Insufficient documentation

## 2020-01-22 DIAGNOSIS — G8929 Other chronic pain: Secondary | ICD-10-CM | POA: Insufficient documentation

## 2020-01-22 DIAGNOSIS — Z79899 Other long term (current) drug therapy: Secondary | ICD-10-CM | POA: Insufficient documentation

## 2020-01-22 DIAGNOSIS — E1142 Type 2 diabetes mellitus with diabetic polyneuropathy: Secondary | ICD-10-CM | POA: Diagnosis not present

## 2020-01-22 DIAGNOSIS — Z87891 Personal history of nicotine dependence: Secondary | ICD-10-CM | POA: Diagnosis not present

## 2020-01-22 DIAGNOSIS — K5190 Ulcerative colitis, unspecified, without complications: Secondary | ICD-10-CM | POA: Insufficient documentation

## 2020-01-22 DIAGNOSIS — F329 Major depressive disorder, single episode, unspecified: Secondary | ICD-10-CM | POA: Insufficient documentation

## 2020-01-22 DIAGNOSIS — K625 Hemorrhage of anus and rectum: Secondary | ICD-10-CM | POA: Insufficient documentation

## 2020-01-22 DIAGNOSIS — M545 Low back pain: Secondary | ICD-10-CM | POA: Insufficient documentation

## 2020-01-22 DIAGNOSIS — K644 Residual hemorrhoidal skin tags: Secondary | ICD-10-CM | POA: Insufficient documentation

## 2020-01-22 DIAGNOSIS — Z9119 Patient's noncompliance with other medical treatment and regimen: Secondary | ICD-10-CM | POA: Diagnosis not present

## 2020-01-22 DIAGNOSIS — Z96611 Presence of right artificial shoulder joint: Secondary | ICD-10-CM | POA: Diagnosis not present

## 2020-01-22 DIAGNOSIS — K219 Gastro-esophageal reflux disease without esophagitis: Secondary | ICD-10-CM | POA: Diagnosis not present

## 2020-01-22 DIAGNOSIS — Z794 Long term (current) use of insulin: Secondary | ICD-10-CM | POA: Diagnosis not present

## 2020-01-22 DIAGNOSIS — Z96643 Presence of artificial hip joint, bilateral: Secondary | ICD-10-CM | POA: Diagnosis not present

## 2020-01-22 HISTORY — PX: COLONOSCOPY WITH PROPOFOL: SHX5780

## 2020-01-22 HISTORY — PX: BIOPSY: SHX5522

## 2020-01-22 LAB — GLUCOSE, CAPILLARY
Glucose-Capillary: 169 mg/dL — ABNORMAL HIGH (ref 70–99)
Glucose-Capillary: 141 mg/dL — ABNORMAL HIGH (ref 70–99)

## 2020-01-22 SURGERY — COLONOSCOPY WITH PROPOFOL
Anesthesia: General

## 2020-01-22 MED ORDER — PROPOFOL 500 MG/50ML IV EMUL
INTRAVENOUS | Status: DC | PRN
  Administered 2020-01-22: 25 ug/kg/min via INTRAVENOUS
  Administered 2020-01-22: 45 ug/kg/min via INTRAVENOUS

## 2020-01-22 MED ORDER — PROPOFOL 10 MG/ML IV BOLUS
INTRAVENOUS | Status: AC
  Filled 2020-01-22: qty 100

## 2020-01-22 MED ORDER — PROPOFOL 10 MG/ML IV BOLUS
INTRAVENOUS | Status: DC | PRN
  Administered 2020-01-22 (×3): 40 mg via INTRAVENOUS
  Administered 2020-01-22: 50 mg via INTRAVENOUS
  Administered 2020-01-22: 20 mg via INTRAVENOUS

## 2020-01-22 MED ORDER — LACTATED RINGERS IV SOLN: INTRAVENOUS | Status: DC

## 2020-01-22 NOTE — Discharge Instructions (Signed)
No aspirin or NSAIDs for 24 hours. Resume usual medications and diet as before. No driving for 24 hours. Physician will call with biopsy results.    Monitored Anesthesia Care, Care After These instructions provide you with information about caring for yourself after your procedure. Your health care provider may also give you more specific instructions. Your treatment has been planned according to current medical practices, but problems sometimes occur. Call your health care provider if you have any problems or questions after your procedure. What can I expect after the procedure? After your procedure, you may:  Feel sleepy for several hours.  Feel clumsy and have poor balance for several hours.  Feel forgetful about what happened after the procedure.  Have poor judgment for several hours.  Feel nauseous or vomit.  Have a sore throat if you had a breathing tube during the procedure. Follow these instructions at home: For at least 24 hours after the procedure:      Have a responsible adult stay with you. It is important to have someone help care for you until you are awake and alert.  Rest as needed.  Do not: ? Participate in activities in which you could fall or become injured. ? Drive. ? Use heavy machinery. ? Drink alcohol. ? Take sleeping pills or medicines that cause drowsiness. ? Make important decisions or sign legal documents. ? Take care of children on your own. Eating and drinking  Follow the diet that is recommended by your health care provider.  If you vomit, drink water, juice, or soup when you can drink without vomiting.  Make sure you have little or no nausea before eating solid foods. General instructions  Take over-the-counter and prescription medicines only as told by your health care provider.  If you have sleep apnea, surgery and certain medicines can increase your risk for breathing problems. Follow instructions from your health care provider  about wearing your sleep device: ? Anytime you are sleeping, including during daytime naps. ? While taking prescription pain medicines, sleeping medicines, or medicines that make you drowsy.  If you smoke, do not smoke without supervision.  Keep all follow-up visits as told by your health care provider. This is important. Contact a health care provider if:  You keep feeling nauseous or you keep vomiting.  You feel light-headed.  You develop a rash.  You have a fever. Get help right away if:  You have trouble breathing. Summary  For several hours after your procedure, you may feel sleepy and have poor judgment.  Have a responsible adult stay with you for at least 24 hours or until you are awake and alert. This information is not intended to replace advice given to you by your health care provider. Make sure you discuss any questions you have with your health care provider. Document Revised: 08/28/2017 Document Reviewed: 09/20/2015 Elsevier Patient Education  [place].    Colonoscopy, Adult, Care After This sheet gives you information about how to care for yourself after your procedure. Your doctor may also give you more specific instructions. If you have problems or questions, call your doctor. What can I expect after the procedure? After the procedure, it is common to have:  A small amount of blood in your poop (stool) for 24 hours.  Some gas.  Mild cramping or bloating in your belly (abdomen). Follow these instructions at home: Eating and drinking   Drink enough fluid to keep your pee (urine) pale yellow.  Follow instructions from your  doctor about what you cannot eat or drink.  Return to your normal diet as told by your doctor. Avoid heavy or fried foods that are hard to digest. Activity  Rest as told by your doctor.  Do not sit for a long time without moving. Get up to take short walks every 1-2 hours. This is important. Ask for help if you feel weak  or unsteady.  Return to your normal activities as told by your doctor. Ask your doctor what activities are safe for you. To help cramping and bloating:   Try walking around.  Put heat on your belly as told by your doctor. Use the heat source that your doctor recommends, such as a moist heat pack or a heating pad. ? Put a towel between your skin and the heat source. ? Leave the heat on for 20-30 minutes. ? Remove the heat if your skin turns bright red. This is very important if you are unable to feel pain, heat, or cold. You may have a greater risk of getting burned. General instructions  For the first 24 hours after the procedure: ? Do not drive or use machinery. ? Do not sign important documents. ? Do not drink alcohol. ? Do your daily activities more slowly than normal. ? Eat foods that are soft and easy to digest.  Take over-the-counter or prescription medicines only as told by your doctor.  Keep all follow-up visits as told by your doctor. This is important. Contact a doctor if:  You have blood in your poop 2-3 days after the procedure. Get help right away if:  You have more than a small amount of blood in your poop.  You see large clumps of tissue (blood clots) in your poop.  Your belly is swollen.  You feel like you may vomit (nauseous).  You vomit.  You have a fever.  You have belly pain that gets worse, and medicine does not help your pain. Summary  After the procedure, it is common to have a small amount of blood in your poop. You may also have mild cramping and bloating in your belly.  For the first 24 hours after the procedure, do not drive or use machinery, do not sign important documents, and do not drink alcohol.  Get help right away if you have a lot of blood in your poop, feel like you may vomit, have a fever, or have more belly pain. This information is not intended to replace advice given to you by your health care provider. Make sure you discuss any  questions you have with your health care provider. Document Revised: 12/24/2018 Document Reviewed: 12/24/2018 Elsevier Patient Education  [place].

## 2020-01-22 NOTE — Op Note (Signed)
[facility] Patient Name: Sheri Reid Procedure Date: 01/22/2020 7:04 AM MRN: [id number] Date of Birth: 1940/10/27 Attending MD: Hildred Laser , MD CSN: [id number] Age: 79 Admit Type: Outpatient Procedure:                Colonoscopy Indications:              Rectal bleeding, Ulcerative colitis, Follow-up of                            ulcerative colitis Providers:                Hildred Laser, MD, Crystal Page, Aram Candela Referring MD:             Moshe Cipro, Md Medicines:                Propofol per Anesthesia Complications:            No immediate complications. Estimated Blood Loss:     Estimated blood loss was minimal. Procedure:                Pre-Anesthesia Assessment:                           - Prior to the procedure, a History and Physical                            was performed, and patient medications and                            allergies were reviewed. The patient's tolerance of                            previous anesthesia was also reviewed. The risks                            and benefits of the procedure and the sedation                            options and risks were discussed with the patient.                            All questions were answered, and informed consent                            was obtained. Prior Anticoagulants: The patient has                            taken no previous anticoagulant or antiplatelet                            agents. ASA Grade Assessment: II - A patient with                            mild systemic disease. After reviewing the risks  and benefits, the patient was deemed in                            satisfactory condition to undergo the procedure.                           After obtaining informed consent, the colonoscope                            was passed under direct vision. Throughout the                            procedure, the patient's blood pressure, pulse, and                             oxygen saturations were monitored continuously. The                            PCF-[id number] ([id number]) scope was introduced through                            the anus and advanced to the the transverse colon.                            The colonoscopy was performed with difficulty due                            to poor bowel prep. The patient tolerated the                            procedure well. The quality of the bowel                            preparation was poor. Scope In: 7:44:27 AM Scope Out: 8:04:13 AM Total Procedure Duration: 0 hours 19 minutes 46 seconds  Findings:      The perianal and digital rectal examinations were normal.      The sigmoid colon, descending colon, splenic flexure and distal       transverse colon appeared normal.      Inflammation was found in the colon. This was graded as Mayo Score 2       (moderate, with marked erythema, absent vascular pattern, friability,       erosions), and when compared to the previous examination, the findings       are worsened. Biopsies for histology were taken with a cold forceps for       evaluation of celiac disease. Biopsies were taken with a cold forceps       for histology. The pathology specimen was placed into Bottle Number 1.      External hemorrhoids were found during retroflexion. The hemorrhoids       were small. Impression:               - Preparation of the colon was poor.                           -  The sigmoid colon, descending colon, splenic                            flexure and distal transverse colon are normal.                           - Moderately active (Mayo Score 2) proctitis                            ulcerative colitis, worsened since the last                            examination. Biopsied.                           - External hemorrhoids. Moderate Sedation:      Per Anesthesia Care Recommendation:           - Patient has a contact number available for                             emergencies. The signs and symptoms of potential                            delayed complications were discussed with the                            patient. Return to normal activities tomorrow.                            Written discharge instructions were provided to the                            patient.                           - Resume previous diet today.                           - Continue present medications.                           - No aspirin, ibuprofen, naproxen, or other                            non-steroidal anti-inflammatory drugs for 1 day.                           - Await pathology results.                           - No recommendation at this time regarding repeat                            colonoscopy. 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 All rights reserved. The codes documented in this report are preliminary and upon coder review may  be revised to meet current compliance requirements. Hildred Laser, MD Hildred Laser, MD 01/22/2020 8:20:14 AM This report has been signed electronically. Number of Addenda: 0

## 2020-01-22 NOTE — H&P (Signed)
Sheri Reid is an 79 y.o. female.   Chief Complaint: Patient is here for colonoscopy. HPI: Patient is 79 year old Caucasian female who has chronic ulcerative colitis who his last colonoscopy was in March 2017 and she was noted to be in remission.  She presents with rectal bleeding.  She did have diarrhea which has stopped on stopping some of the medications including p.o. iron.  He has good appetite.  She denies weight loss.  She does not take OTC NSAIDs. Patient states she did notice some blood this morning with a bowel movement.  She did not pass any blood yesterday when she took the prep. Lab studies are pertinent for mildly elevated CRP and a sed rate.  Her H&H is normal.  Past Medical History:  Diagnosis Date  . Arthritis    "back" (08/13/2015)  . Chronic lower back pain   . Constipation due to pain medication   . Depression    with pain  . Diabetic peripheral neuropathy ([place])   . GERD (gastroesophageal reflux disease)   . Tremor of unknown origin    family has it  . Type II diabetes mellitus ([place])   . Ulcerative colitis [facility])     Past Surgical History:  Procedure Laterality Date  . ANTERIOR LAT LUMBAR FUSION Left 08/13/2015   Procedure: Left Access L2-3 L3-4 L4-5 Anterolateral interbody fusion ;  Surgeon: Erline Levine, MD;  Location: [place] [facility];  Service: Neurosurgery;  Laterality: Left;  Left Access L2-3 L3-4 L4-5 Anterolateral interbody fusion   . ANTERIOR LATERAL LUMBAR FUSION WITH PERCUTANEOUS SCREW 3 LEVEL Left 08/13/2015   L2-3 L3-4 L4-5 Anterolateral interbody fusion with percutaneous pedicle screws   . BACK SURGERY    . CATARACT EXTRACTION W/ INTRAOCULAR LENS  IMPLANT, BILATERAL Bilateral   . COLONOSCOPY    . COLONOSCOPY N/A 08/21/2015   Procedure: COLONOSCOPY;  Surgeon: Carol Ada, MD;  Location: [facility];  Service: Endoscopy;  Laterality: N/A;  . JOINT REPLACEMENT    . LUMBAR PERCUTANEOUS PEDICLE SCREW 3 LEVEL N/A 08/13/2015   Procedure: L2-3 L3-4 L4-5  Anterolateral interbody fusion with percutaneous pedicle screws;  Surgeon: Erline Levine, MD;  Location: [place] [facility];  Service: Neurosurgery;  Laterality: N/A;  L2-3 L3-4 L4-5 Anterolateral interbody fusion with percutaneous pedicle screws  . 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 TOTAL SHOULDER ARTHROPLASTY Right 07/10/11    History reviewed. No pertinent family history. Social History:  reports that she has quit smoking. Her smoking use included cigarettes. She has a 45.00 pack-year smoking history. She has never used smokeless tobacco. She reports current alcohol use of about 2.0 standard drinks of alcohol per week. She reports that she does not use drugs.  Allergies: No Known Allergies  Medications Prior to Admission  Medication Sig Dispense Refill  . Ascorbic Acid (VITAMIN C) 1000 MG tablet Take 1,000 mg by mouth daily.    Marland Kitchen atorvastatin (LIPITOR) 20 MG tablet Take 20 mg by mouth daily.    . Cholecalciferol (VITAMIN D) 50 MCG (2000 UT) tablet Take 2,000 Units by mouth daily.     . Coenzyme Q10 (CO Q-10) 100 MG CAPS Take 100 mg by mouth daily.     . Cranberry 450 MG TABS Take 450 mg by mouth every morning.     . escitalopram (LEXAPRO) 20 MG tablet Take 20 mg by mouth daily.     . Insulin Degludec (TRESIBA) 100 UNIT/ML SOLN Inject 10-15 Units into the skin at bedtime.    Marland Kitchen losartan (  COZAAR) 50 MG tablet Take 100 mg by mouth daily.     . mesalamine (APRISO) 0.375 g 24 hr capsule Take 1.5 g by mouth every morning.    . metFORMIN (GLUCOPHAGE) 1000 MG tablet Take 1,000 mg by mouth 2 (two) times daily with a meal.     . Multiple Vitamin (MULTIVITAMIN) tablet Take 1 tablet by mouth daily.    Marland Kitchen omeprazole (PRILOSEC) 20 MG capsule Take 20 mg by mouth in the morning and at bedtime.     Marland Kitchen OZEMPIC, 1 MG/DOSE, 2 MG/1.5ML SOPN Inject 1 mg into the skin once a week.    . Probiotic Product (PROBIOTIC DAILY PO) Take 1 capsule by mouth daily.     . Turmeric 500 MG CAPS Take 500 mg by mouth daily.      . vitamin B-12 (CYANOCOBALAMIN) 1000 MCG tablet Take 1,000 mcg by mouth daily.     Marland Kitchen acetaminophen (TYLENOL) 500 MG tablet Take 500-1,000 mg by mouth 2 (two) times daily as needed for moderate pain. (Patient not taking: Reported on 01/07/2020)    . Bismuth Subsalicylate ([employer] BISMUTH) 262 MG TABS Take 262 mg by mouth 3 (three) times daily. (Patient not taking: Reported on 01/07/2020)    . ergocalciferol (VITAMIN D2) 50000 units capsule Take 50,000 Units by mouth every 14 (fourteen) days. (Patient not taking: Reported on 01/07/2020)    . lisinopril (PRINIVIL,ZESTRIL) 5 MG tablet Take 10 mg by mouth daily. (Patient not taking: Reported on 12/23/2019)    . loperamide (IMODIUM A-D) 2 MG tablet Take 2 mg by mouth 2 (two) times daily. (Patient not taking: Reported on 01/07/2020)    . Multiple Vitamins-Minerals (PRESERVISION AREDS 2) CAPS Take 1 capsule by mouth 2 (two) times daily. (Patient not taking: Reported on 01/07/2020)    . OZEMPIC, 0.25 OR 0.5 MG/DOSE, 2 MG/1.5ML SOPN once a week. Saturday (Patient not taking: Reported on 01/07/2020)    . primidone (MYSOLINE) 50 MG tablet Take 1 tablet by mouth daily. (Patient not taking: Reported on 01/07/2020)    . sulfaSALAzine (AZULFIDINE) 500 MG tablet Take 2 tablets (1,000 mg total) by mouth 3 (three) times daily. (Patient not taking: Reported on 12/23/2019)    . traMADol (ULTRAM) 50 MG tablet Take 1 tablet (50 mg total) by mouth every 6 (six) hours as needed for moderate pain. (Patient not taking: Reported on 06/20/2019) 30 tablet 0    Results for orders placed or performed during the hospital encounter of 01/22/20 (from the past 48 hour(s))  Glucose, capillary     Status: Abnormal   Collection Time: 01/22/20  6:50 AM  Result Value Ref Range   Glucose-Capillary 169 (H) 70 - 99 mg/dL    Comment: Glucose reference range applies only to samples taken after fasting for at least 8 hours.   No results found.  Review of Systems  Blood pressure (!) 143/70,  temperature 97.9 F (36.6 C), temperature source Oral, resp. rate 18, SpO2 99 %. Physical Exam Constitutional:      Comments: Patient is thin.  HENT:     Mouth/Throat:     Mouth: Mucous membranes are moist.     Pharynx: Oropharynx is clear.  Eyes:     General: No scleral icterus.    Conjunctiva/sclera: Conjunctivae normal.  Cardiovascular:     Rate and Rhythm: Normal rate and regular rhythm.     Heart sounds: Normal heart sounds. No murmur heard.   Pulmonary:     Effort: Pulmonary effort is normal.  Breath sounds: Normal breath sounds.  Abdominal:     Comments: Abdomen is flat soft and nontender with organomegaly or masses.  Musculoskeletal:        General: No swelling.     Cervical back: Neck supple.  Lymphadenopathy:     Cervical: No cervical adenopathy.  Skin:    General: Skin is warm and dry.  Neurological:     General: No focal deficit present.     Mental Status: She is alert and oriented to person, place, and time.      Assessment/Plan Chronic ulcerative colitis. Rectal bleeding.  Hildred Laser, MD 01/22/2020, 7:26 AM

## 2020-01-22 NOTE — Anesthesia Preprocedure Evaluation (Signed)
Anesthesia Evaluation  Patient identified by MRN, date of birth, ID band Patient awake    Reviewed: Allergy & Precautions, H&P , NPO status , Patient's Chart, lab work & pertinent test results, reviewed documented beta blocker date and time   Airway Mallampati: II  TM Distance: >3 FB Neck ROM: full    Dental no notable dental hx.    Pulmonary neg pulmonary ROS, former smoker,    Pulmonary exam normal breath sounds clear to auscultation       Cardiovascular Exercise Tolerance: Good negative cardio ROS   Rhythm:regular Rate:Normal     Neuro/Psych PSYCHIATRIC DISORDERS Depression  Neuromuscular disease    GI/Hepatic Neg liver ROS, PUD, GERD  Medicated,  Endo/Other  negative endocrine ROSdiabetes  Renal/GU negative Renal ROS  negative genitourinary   Musculoskeletal   Abdominal   Peds  Hematology negative hematology ROS (+)   Anesthesia Other Findings   Reproductive/Obstetrics negative OB ROS                             Anesthesia Physical Anesthesia Plan  ASA: II  Anesthesia Plan: General   Post-op Pain Management:    Induction:   PONV Risk Score and Plan: Propofol infusion  Airway Management Planned:   Additional Equipment:   Intra-op Plan:   Post-operative Plan:   Informed Consent: I have reviewed the patients History and Physical, chart, labs and discussed the procedure including the risks, benefits and alternatives for the proposed anesthesia with the patient or authorized representative who has indicated his/her understanding and acceptance.     Dental Advisory Given  Plan Discussed with: CRNA  Anesthesia Plan Comments:         Anesthesia Quick Evaluation

## 2020-01-22 NOTE — Anesthesia Postprocedure Evaluation (Signed)
Anesthesia Post Note  Patient: Sheri Reid  Procedure(s) Performed: COLONOSCOPY WITH PROPOFOL (N/A ) BIOPSY  Patient location during evaluation: PACU Anesthesia Type: General Level of consciousness: awake and alert and patient cooperative Pain management: satisfactory to patient Vital Signs Assessment: post-procedure vital signs reviewed and stable Respiratory status: spontaneous breathing Cardiovascular status: stable Postop Assessment: no apparent nausea or vomiting Anesthetic complications: no   No complications documented.   Last Vitals:  Vitals:   01/22/20 0649 01/22/20 0813  BP: (!) 143/70 130/61  Pulse:  92  Resp: 18 17  Temp: 36.6 C 36.5 C  SpO2: 99% 99%    Last Pain:  Vitals:   01/22/20 0813  TempSrc:   PainSc: 0-No pain                 Ruxin Ransome

## 2020-01-22 NOTE — Anesthesia Procedure Notes (Signed)
Date/Time: 01/22/2020 7:35 AM Performed by: Vista Deck, CRNA Pre-anesthesia Checklist: Patient identified, Emergency Drugs available, Suction available, Timeout performed and Patient being monitored Patient Re-evaluated:Patient Re-evaluated prior to induction Oxygen Delivery Method: Nasal Cannula

## 2020-01-22 NOTE — Transfer of Care (Signed)
Immediate Anesthesia Transfer of Care Note  Patient: Sheri Reid  Procedure(s) Performed: COLONOSCOPY WITH PROPOFOL (N/A ) BIOPSY  Patient Location: PACU  Anesthesia Type:General  Level of Consciousness: awake, alert  and patient cooperative  Airway & Oxygen Therapy: Patient Spontanous Breathing  Post-op Assessment: Report given to RN and Post -op Vital signs reviewed and unstable, Anesthesiologist notified  Post vital signs: Reviewed and stable  Last Vitals:  Vitals Value Taken Time  BP 130/61 01/22/20 0813  Temp 97.8   Pulse 95 01/22/20 0814  Resp 17 01/22/20 0814  SpO2 98 % 01/22/20 0814  Vitals shown include unvalidated device data.  Last Pain:  Vitals:   01/22/20 0737  TempSrc:   PainSc: 0-No pain      Patients Stated Pain Goal: 7 (22/57/50 [id number])  Complications: No complications documented.

## 2020-01-23 LAB — SURGICAL PATHOLOGY

## 2020-01-24 ENCOUNTER — Other Ambulatory Visit (INDEPENDENT_AMBULATORY_CARE_PROVIDER_SITE_OTHER): Payer: Self-pay | Admitting: Internal Medicine

## 2020-01-24 MED ORDER — MESALAMINE 4 G RE ENEM: 4.0000 g | ENEMA | Freq: Every day | RECTAL | 0 refills | Status: AC

## 2020-01-27 ENCOUNTER — Encounter (HOSPITAL_COMMUNITY): Payer: Self-pay | Admitting: Internal Medicine

## 2020-05-04 ENCOUNTER — Other Ambulatory Visit (INDEPENDENT_AMBULATORY_CARE_PROVIDER_SITE_OTHER): Payer: Self-pay | Admitting: Internal Medicine

## 2020-05-04 NOTE — Telephone Encounter (Signed)
Last seen 12/23/2019 rectal bleed [place] Headings.

## 2020-06-10 ENCOUNTER — Telehealth (INDEPENDENT_AMBULATORY_CARE_PROVIDER_SITE_OTHER): Payer: Self-pay

## 2020-06-23 ENCOUNTER — Ambulatory Visit (INDEPENDENT_AMBULATORY_CARE_PROVIDER_SITE_OTHER): Payer: Medicare Other | Admitting: Internal Medicine

## 2022-10-12 DEATH — deceased
